# Patient Record
Sex: Female | Born: 1950
Health system: Southern US, Community
[De-identification: ages and names within clinical notes are randomized; demographics above are authoritative.]

## PROBLEM LIST (undated history)

## (undated) DIAGNOSIS — I499 Cardiac arrhythmia, unspecified: Secondary | ICD-10-CM

## (undated) DIAGNOSIS — K219 Gastro-esophageal reflux disease without esophagitis: Secondary | ICD-10-CM

## (undated) DIAGNOSIS — M8580 Other specified disorders of bone density and structure, unspecified site: Secondary | ICD-10-CM

## (undated) DIAGNOSIS — K649 Unspecified hemorrhoids: Secondary | ICD-10-CM

## (undated) DIAGNOSIS — R319 Hematuria, unspecified: Secondary | ICD-10-CM

## (undated) DIAGNOSIS — J111 Influenza due to unidentified influenza virus with other respiratory manifestations: Secondary | ICD-10-CM

## (undated) DIAGNOSIS — R011 Cardiac murmur, unspecified: Secondary | ICD-10-CM

## (undated) DIAGNOSIS — I10 Essential (primary) hypertension: Secondary | ICD-10-CM

## (undated) HISTORY — PX: HEMORRHOID BANDING: SHX5850

## (undated) HISTORY — PX: DENTAL SURGERY: SHX609

## (undated) HISTORY — PX: COLONOSCOPY W/ BIOPSIES AND POLYPECTOMY: SHX1376

## (undated) HISTORY — PX: BREAST SURGERY: SHX581

## (undated) HISTORY — PX: BREAST BIOPSY: SHX20

## (undated) HISTORY — PX: TONSILLECTOMY: SUR1361

## (undated) HISTORY — DX: Essential (primary) hypertension: I10

---

## 1983-06-27 HISTORY — PX: BREAST EXCISIONAL BIOPSY: SUR124

## 1988-06-26 HISTORY — PX: BREAST EXCISIONAL BIOPSY: SUR124

## 1998-10-19 ENCOUNTER — Other Ambulatory Visit: Admission: RE | Admit: 1998-10-19 | Discharge: 1998-10-19 | Payer: Self-pay | Admitting: Obstetrics & Gynecology

## 2000-04-16 ENCOUNTER — Other Ambulatory Visit: Admission: RE | Admit: 2000-04-16 | Discharge: 2000-04-16 | Payer: Self-pay | Admitting: Obstetrics & Gynecology

## 2001-08-06 ENCOUNTER — Other Ambulatory Visit: Admission: RE | Admit: 2001-08-06 | Discharge: 2001-08-06 | Payer: Self-pay | Admitting: Obstetrics & Gynecology

## 2002-09-05 ENCOUNTER — Other Ambulatory Visit: Admission: RE | Admit: 2002-09-05 | Discharge: 2002-09-05 | Payer: Self-pay | Admitting: Obstetrics & Gynecology

## 2002-10-19 ENCOUNTER — Ambulatory Visit (HOSPITAL_COMMUNITY): Admission: RE | Admit: 2002-10-19 | Discharge: 2002-10-19 | Payer: Self-pay | Admitting: Urology

## 2002-10-19 ENCOUNTER — Encounter: Payer: Self-pay | Admitting: Urology

## 2003-10-02 ENCOUNTER — Other Ambulatory Visit: Admission: RE | Admit: 2003-10-02 | Discharge: 2003-10-02 | Payer: Self-pay | Admitting: Obstetrics & Gynecology

## 2004-06-30 ENCOUNTER — Encounter: Admission: RE | Admit: 2004-06-30 | Discharge: 2004-06-30 | Payer: Self-pay | Admitting: Obstetrics & Gynecology

## 2004-07-07 ENCOUNTER — Encounter: Admission: RE | Admit: 2004-07-07 | Discharge: 2004-07-07 | Payer: Self-pay | Admitting: Surgery

## 2004-11-03 ENCOUNTER — Other Ambulatory Visit: Admission: RE | Admit: 2004-11-03 | Discharge: 2004-11-03 | Payer: Self-pay | Admitting: Obstetrics and Gynecology

## 2005-09-26 ENCOUNTER — Ambulatory Visit (HOSPITAL_COMMUNITY): Admission: RE | Admit: 2005-09-26 | Discharge: 2005-09-26 | Payer: Self-pay | Admitting: Obstetrics & Gynecology

## 2006-10-16 ENCOUNTER — Ambulatory Visit (HOSPITAL_COMMUNITY): Admission: RE | Admit: 2006-10-16 | Discharge: 2006-10-16 | Payer: Self-pay | Admitting: Obstetrics & Gynecology

## 2007-03-12 ENCOUNTER — Encounter: Admission: RE | Admit: 2007-03-12 | Discharge: 2007-03-12 | Payer: Self-pay | Admitting: Obstetrics & Gynecology

## 2008-07-24 ENCOUNTER — Encounter: Admission: RE | Admit: 2008-07-24 | Discharge: 2008-07-24 | Payer: Self-pay | Admitting: Obstetrics & Gynecology

## 2008-09-11 ENCOUNTER — Ambulatory Visit: Payer: Self-pay

## 2009-08-10 ENCOUNTER — Encounter: Admission: RE | Admit: 2009-08-10 | Discharge: 2009-08-10 | Payer: Self-pay | Admitting: Obstetrics & Gynecology

## 2010-07-17 ENCOUNTER — Encounter: Payer: Self-pay | Admitting: Obstetrics & Gynecology

## 2011-07-12 ENCOUNTER — Other Ambulatory Visit: Payer: Self-pay | Admitting: Obstetrics & Gynecology

## 2011-07-12 DIAGNOSIS — R928 Other abnormal and inconclusive findings on diagnostic imaging of breast: Secondary | ICD-10-CM

## 2011-07-19 ENCOUNTER — Ambulatory Visit
Admission: RE | Admit: 2011-07-19 | Discharge: 2011-07-19 | Disposition: A | Discharge status: Home / Self Care | Payer: 59 | Source: Ambulatory Visit | Attending: Obstetrics & Gynecology | Admitting: Obstetrics & Gynecology

## 2011-07-19 ENCOUNTER — Other Ambulatory Visit: Payer: Self-pay | Admitting: Obstetrics & Gynecology

## 2011-07-19 DIAGNOSIS — R928 Other abnormal and inconclusive findings on diagnostic imaging of breast: Secondary | ICD-10-CM

## 2011-08-28 ENCOUNTER — Other Ambulatory Visit: Payer: Self-pay | Admitting: Gastroenterology

## 2012-11-13 ENCOUNTER — Other Ambulatory Visit: Payer: Self-pay | Admitting: Obstetrics & Gynecology

## 2012-11-13 DIAGNOSIS — R928 Other abnormal and inconclusive findings on diagnostic imaging of breast: Secondary | ICD-10-CM

## 2012-11-15 ENCOUNTER — Ambulatory Visit
Admission: RE | Admit: 2012-11-15 | Discharge: 2012-11-15 | Disposition: A | Discharge status: Home / Self Care | Payer: 59 | Source: Ambulatory Visit | Attending: Obstetrics & Gynecology | Admitting: Obstetrics & Gynecology

## 2012-11-15 DIAGNOSIS — R928 Other abnormal and inconclusive findings on diagnostic imaging of breast: Secondary | ICD-10-CM

## 2012-11-26 ENCOUNTER — Other Ambulatory Visit: Payer: 59

## 2013-07-27 DIAGNOSIS — J111 Influenza due to unidentified influenza virus with other respiratory manifestations: Secondary | ICD-10-CM

## 2013-07-27 HISTORY — DX: Influenza due to unidentified influenza virus with other respiratory manifestations: J11.1

## 2013-08-15 ENCOUNTER — Other Ambulatory Visit (HOSPITAL_COMMUNITY): Payer: Self-pay | Admitting: Orthopaedic Surgery

## 2013-08-15 ENCOUNTER — Ambulatory Visit (HOSPITAL_COMMUNITY)
Admission: RE | Admit: 2013-08-15 | Discharge: 2013-08-15 | Disposition: A | Discharge status: Home / Self Care | Payer: Worker's Compensation | Source: Ambulatory Visit | Attending: Orthopaedic Surgery | Admitting: Orthopaedic Surgery

## 2013-08-15 DIAGNOSIS — M25571 Pain in right ankle and joints of right foot: Secondary | ICD-10-CM

## 2013-08-15 DIAGNOSIS — X58XXXA Exposure to other specified factors, initial encounter: Secondary | ICD-10-CM | POA: Insufficient documentation

## 2013-08-15 DIAGNOSIS — S82853A Displaced trimalleolar fracture of unspecified lower leg, initial encounter for closed fracture: Secondary | ICD-10-CM | POA: Insufficient documentation

## 2013-08-18 ENCOUNTER — Encounter (HOSPITAL_COMMUNITY): Payer: Self-pay | Admitting: Pharmacy Technician

## 2013-08-18 NOTE — Pre-Procedure Instructions (Addendum)
Natalie Romero  08/18/2013   Your procedure is scheduled on:  Wednesday, February 25.  [facility]/ Entrance "A" at 8:30AM.  Call this number if you have problems the morning of surgery: [phone]   Remember:   Do not eat food or drink liquids after midnight.    Take these medicines the morning of surgery with A SIP OF WATER: Estradiol-Norethindrone Acet (MIMVEY LO).   Take if needed:HYDROcodone-acetaminophen (NORCO/VICODIN), ondansetron (ZOFRAN-ODT).  Stop taking ibuprofen (ADVIL,MOTRIN).    Do not wear jewelry, make-up or nail polish.  Do not wear lotions, powders, or perfumes. You may wear deodorant.  Do not shave 48 hours prior to surgery.   Do not bring valuables to the hospital.  [facility] not responsible for any belongings or valuables.               Contacts, dentures or bridgework may not be worn into surgery.  Leave suitcase in the car. After surgery it may be brought to your room.  For patients admitted to the hospital, discharge time is determined by your treatment team.               Patients discharged the day of surgery will not be allowed to drive home.  Name and phone number of your driver: -   Special Instructions: Review  [facility] - Preparing For Surgery.   Please read over the following fact sheets that you were given: Pain Booklet, Coughing and Deep Breathing and Surgical Site Infection Prevention

## 2013-08-19 ENCOUNTER — Encounter (HOSPITAL_COMMUNITY)
Admission: RE | Admit: 2013-08-19 | Discharge: 2013-08-19 | Disposition: A | Discharge status: Home / Self Care | Payer: Worker's Compensation | Source: Ambulatory Visit | Attending: Orthopaedic Surgery | Admitting: Orthopaedic Surgery

## 2013-08-19 ENCOUNTER — Other Ambulatory Visit: Payer: Self-pay

## 2013-08-19 ENCOUNTER — Encounter (HOSPITAL_COMMUNITY): Payer: Self-pay

## 2013-08-19 DIAGNOSIS — I499 Cardiac arrhythmia, unspecified: Secondary | ICD-10-CM | POA: Diagnosis not present

## 2013-08-19 DIAGNOSIS — R011 Cardiac murmur, unspecified: Secondary | ICD-10-CM | POA: Diagnosis not present

## 2013-08-19 DIAGNOSIS — X58XXXA Exposure to other specified factors, initial encounter: Secondary | ICD-10-CM | POA: Diagnosis not present

## 2013-08-19 DIAGNOSIS — K219 Gastro-esophageal reflux disease without esophagitis: Secondary | ICD-10-CM | POA: Diagnosis not present

## 2013-08-19 DIAGNOSIS — S82853A Displaced trimalleolar fracture of unspecified lower leg, initial encounter for closed fracture: Secondary | ICD-10-CM | POA: Diagnosis not present

## 2013-08-19 HISTORY — DX: Unspecified hemorrhoids: K64.9

## 2013-08-19 HISTORY — DX: Cardiac arrhythmia, unspecified: I49.9

## 2013-08-19 HISTORY — DX: Cardiac murmur, unspecified: R01.1

## 2013-08-19 HISTORY — DX: Gastro-esophageal reflux disease without esophagitis: K21.9

## 2013-08-19 HISTORY — DX: Influenza due to unidentified influenza virus with other respiratory manifestations: J11.1

## 2013-08-19 HISTORY — DX: Hematuria, unspecified: R31.9

## 2013-08-19 HISTORY — DX: Other specified disorders of bone density and structure, unspecified site: M85.80

## 2013-08-19 LAB — CBC
HCT: 39.9 % (ref 36.0–46.0)
Hemoglobin: 13.3 g/dL (ref 12.0–15.0)
MCH: 30 pg (ref 26.0–34.0)
MCHC: 33.3 g/dL (ref 30.0–36.0)
MCV: 89.9 fL (ref 78.0–100.0)
PLATELETS: 345 10*3/uL (ref 150–400)
RBC: 4.44 MIL/uL (ref 3.87–5.11)
RDW: 13.4 % (ref 11.5–15.5)
WBC: 7.1 10*3/uL (ref 4.0–10.5)

## 2013-08-19 MED ORDER — CEFAZOLIN SODIUM-DEXTROSE 2-3 GM-% IV SOLR
2.0000 g | INTRAVENOUS | Status: AC
  Administered 2013-08-20: 2 g via INTRAVENOUS

## 2013-08-20 ENCOUNTER — Ambulatory Visit (HOSPITAL_COMMUNITY): Payer: Worker's Compensation | Admitting: Anesthesiology

## 2013-08-20 ENCOUNTER — Encounter (HOSPITAL_COMMUNITY): Payer: Worker's Compensation | Admitting: Anesthesiology

## 2013-08-20 ENCOUNTER — Ambulatory Visit (HOSPITAL_COMMUNITY): Payer: Worker's Compensation

## 2013-08-20 ENCOUNTER — Encounter (HOSPITAL_COMMUNITY)
Admission: RE | Disposition: A | Discharge status: Home / Self Care | Payer: Self-pay | Source: Ambulatory Visit | Attending: Orthopaedic Surgery

## 2013-08-20 ENCOUNTER — Observation Stay (HOSPITAL_COMMUNITY)
Admission: RE | Admit: 2013-08-20 | Discharge: 2013-08-21 | Disposition: A | Discharge status: Home / Self Care | Payer: Worker's Compensation | Source: Ambulatory Visit | Attending: Orthopaedic Surgery | Admitting: Orthopaedic Surgery

## 2013-08-20 ENCOUNTER — Encounter (HOSPITAL_COMMUNITY): Payer: Self-pay | Admitting: *Deleted

## 2013-08-20 DIAGNOSIS — I499 Cardiac arrhythmia, unspecified: Secondary | ICD-10-CM | POA: Insufficient documentation

## 2013-08-20 DIAGNOSIS — R011 Cardiac murmur, unspecified: Secondary | ICD-10-CM | POA: Insufficient documentation

## 2013-08-20 DIAGNOSIS — S82853A Displaced trimalleolar fracture of unspecified lower leg, initial encounter for closed fracture: Principal | ICD-10-CM | POA: Insufficient documentation

## 2013-08-20 DIAGNOSIS — K219 Gastro-esophageal reflux disease without esophagitis: Secondary | ICD-10-CM | POA: Insufficient documentation

## 2013-08-20 DIAGNOSIS — X58XXXA Exposure to other specified factors, initial encounter: Secondary | ICD-10-CM | POA: Insufficient documentation

## 2013-08-20 HISTORY — PX: ORIF ANKLE FRACTURE: SHX5408

## 2013-08-20 SURGERY — OPEN REDUCTION INTERNAL FIXATION (ORIF) ANKLE FRACTURE
Anesthesia: Regional | Laterality: Right

## 2013-08-20 MED ORDER — SORBITOL 70 % SOLN: 30.0000 mL | Freq: Every day | Status: DC | PRN

## 2013-08-20 MED ORDER — OXYCODONE HCL 5 MG/5ML PO SOLN: 5.0000 mg | Freq: Once | ORAL | Status: DC | PRN

## 2013-08-20 MED ORDER — ONDANSETRON HCL 4 MG/2ML IJ SOLN
INTRAMUSCULAR | Status: DC | PRN
  Administered 2013-08-20: 4 mg via INTRAVENOUS

## 2013-08-20 MED ORDER — ASPIRIN EC 325 MG PO TBEC: 325.0000 mg | DELAYED_RELEASE_TABLET | Freq: Two times a day (BID) | ORAL | Status: DC

## 2013-08-20 MED ORDER — SENNA 8.6 MG PO TABS
1.0000 | ORAL_TABLET | Freq: Two times a day (BID) | ORAL | Status: DC
  Administered 2013-08-20 – 2013-08-21 (×2): 8.6 mg via ORAL
  Filled 2013-08-20 (×3): qty 1

## 2013-08-20 MED ORDER — ARTIFICIAL TEARS OP OINT
TOPICAL_OINTMENT | OPHTHALMIC | Status: DC | PRN
  Administered 2013-08-20: 1 via OPHTHALMIC

## 2013-08-20 MED ORDER — FENTANYL CITRATE 0.05 MG/ML IJ SOLN
INTRAMUSCULAR | Status: DC | PRN
  Administered 2013-08-20 (×3): 25 ug via INTRAVENOUS
  Administered 2013-08-20: 50 ug via INTRAVENOUS
  Administered 2013-08-20 (×3): 25 ug via INTRAVENOUS
  Administered 2013-08-20: 50 ug via INTRAVENOUS

## 2013-08-20 MED ORDER — METOCLOPRAMIDE HCL 10 MG PO TABS: 5.0000 mg | ORAL_TABLET | Freq: Three times a day (TID) | ORAL | Status: DC | PRN

## 2013-08-20 MED ORDER — OXYCODONE HCL 5 MG PO TABS: 5.0000 mg | ORAL_TABLET | ORAL | Status: DC | PRN

## 2013-08-20 MED ORDER — HYDROCODONE-ACETAMINOPHEN 5-325 MG PO TABS: 1.0000 | ORAL_TABLET | Freq: Four times a day (QID) | ORAL | Status: DC | PRN

## 2013-08-20 MED ORDER — ONDANSETRON HCL 4 MG/2ML IJ SOLN
INTRAMUSCULAR | Status: AC
  Filled 2013-08-20: qty 2

## 2013-08-20 MED ORDER — FENTANYL CITRATE 0.05 MG/ML IJ SOLN
50.0000 ug | INTRAMUSCULAR | Status: DC | PRN
  Administered 2013-08-20: 50 ug via INTRAVENOUS
  Filled 2013-08-20: qty 2

## 2013-08-20 MED ORDER — HYDROCODONE-ACETAMINOPHEN 5-325 MG PO TABS: 1.0000 | ORAL_TABLET | ORAL | Status: DC | PRN

## 2013-08-20 MED ORDER — FENTANYL CITRATE 0.05 MG/ML IJ SOLN
INTRAMUSCULAR | Status: AC
  Filled 2013-08-20: qty 5

## 2013-08-20 MED ORDER — LIDOCAINE HCL (CARDIAC) 20 MG/ML IV SOLN
INTRAVENOUS | Status: AC
  Filled 2013-08-20: qty 5

## 2013-08-20 MED ORDER — FAMOTIDINE 10 MG PO TABS
10.0000 mg | ORAL_TABLET | Freq: Every day | ORAL | Status: DC | PRN
  Filled 2013-08-20: qty 1

## 2013-08-20 MED ORDER — POLYETHYLENE GLYCOL 3350 17 G PO PACK: 17.0000 g | PACK | Freq: Two times a day (BID) | ORAL | Status: DC | PRN

## 2013-08-20 MED ORDER — POLYETHYLENE GLYCOL 3350 17 GM/SCOOP PO POWD: 1.0000 | Freq: Two times a day (BID) | ORAL | Status: DC | PRN

## 2013-08-20 MED ORDER — DIPHENHYDRAMINE HCL 12.5 MG/5ML PO ELIX: 25.0000 mg | ORAL_SOLUTION | ORAL | Status: DC | PRN

## 2013-08-20 MED ORDER — KETOROLAC TROMETHAMINE 30 MG/ML IJ SOLN: 30.0000 mg | Freq: Four times a day (QID) | INTRAMUSCULAR | Status: DC | PRN

## 2013-08-20 MED ORDER — KETOROLAC TROMETHAMINE 0.5 % OP SOLN
1.0000 [drp] | Freq: Four times a day (QID) | OPHTHALMIC | Status: DC
  Administered 2013-08-20 – 2013-08-21 (×3): 1 [drp] via OPHTHALMIC
  Filled 2013-08-20: qty 3

## 2013-08-20 MED ORDER — ONDANSETRON HCL 4 MG/2ML IJ SOLN: 4.0000 mg | Freq: Once | INTRAMUSCULAR | Status: DC | PRN

## 2013-08-20 MED ORDER — 0.9 % SODIUM CHLORIDE (POUR BTL) OPTIME
TOPICAL | Status: DC | PRN
  Administered 2013-08-20: 1000 mL

## 2013-08-20 MED ORDER — CALCIUM CARBONATE-VITAMIN D 500-200 MG-UNIT PO TABS
1.0000 | ORAL_TABLET | Freq: Three times a day (TID) | ORAL | Status: DC
Start: 2013-08-20 — End: 2022-02-17

## 2013-08-20 MED ORDER — PROPOFOL 10 MG/ML IV BOLUS
INTRAVENOUS | Status: AC
  Filled 2013-08-20: qty 20

## 2013-08-20 MED ORDER — ONDANSETRON 4 MG PO TBDP: 4.0000 mg | ORAL_TABLET | Freq: Three times a day (TID) | ORAL | Status: DC | PRN

## 2013-08-20 MED ORDER — PROPOFOL 10 MG/ML IV BOLUS
INTRAVENOUS | Status: DC | PRN
  Administered 2013-08-20: 170 mg via INTRAVENOUS

## 2013-08-20 MED ORDER — CEFAZOLIN SODIUM-DEXTROSE 2-3 GM-% IV SOLR
INTRAVENOUS | Status: AC
  Filled 2013-08-20: qty 50

## 2013-08-20 MED ORDER — MIDAZOLAM HCL 2 MG/2ML IJ SOLN
1.0000 mg | INTRAMUSCULAR | Status: DC | PRN
  Administered 2013-08-20: 1 mg via INTRAVENOUS
  Filled 2013-08-20: qty 2

## 2013-08-20 MED ORDER — HYDROMORPHONE HCL PF 1 MG/ML IJ SOLN
INTRAMUSCULAR | Status: AC
  Administered 2013-08-20: 0.5 mg via INTRAVENOUS
  Filled 2013-08-20: qty 1

## 2013-08-20 MED ORDER — LACTATED RINGERS IV SOLN
INTRAVENOUS | Status: DC
  Administered 2013-08-20: 09:00:00 via INTRAVENOUS

## 2013-08-20 MED ORDER — MORPHINE SULFATE 2 MG/ML IJ SOLN: 1.0000 mg | INTRAMUSCULAR | Status: DC | PRN

## 2013-08-20 MED ORDER — OXYCODONE HCL 5 MG PO TABS: 5.0000 mg | ORAL_TABLET | Freq: Once | ORAL | Status: DC | PRN

## 2013-08-20 MED ORDER — FAMOTIDINE-CA CARB-MAG HYDROX 10-800-165 MG PO CHEW: 1.0000 | CHEWABLE_TABLET | Freq: Every day | ORAL | Status: DC | PRN

## 2013-08-20 MED ORDER — POLYETHYLENE GLYCOL 3350 17 G PO PACK: 17.0000 g | PACK | Freq: Every day | ORAL | Status: DC | PRN

## 2013-08-20 MED ORDER — METOCLOPRAMIDE HCL 5 MG/ML IJ SOLN: 5.0000 mg | Freq: Three times a day (TID) | INTRAMUSCULAR | Status: DC | PRN

## 2013-08-20 MED ORDER — CALCIUM CARBONATE 1250 (500 CA) MG PO TABS
1.0000 | ORAL_TABLET | Freq: Every day | ORAL | Status: DC
  Administered 2013-08-21: 500 mg via ORAL
  Filled 2013-08-20 (×2): qty 1

## 2013-08-20 MED ORDER — ASPIRIN EC 325 MG PO TBEC
325.0000 mg | DELAYED_RELEASE_TABLET | Freq: Two times a day (BID) | ORAL | Status: DC
  Administered 2013-08-20 – 2013-08-21 (×2): 325 mg via ORAL
  Filled 2013-08-20 (×4): qty 1

## 2013-08-20 MED ORDER — ONDANSETRON HCL 4 MG/2ML IJ SOLN: 4.0000 mg | Freq: Four times a day (QID) | INTRAMUSCULAR | Status: DC | PRN

## 2013-08-20 MED ORDER — LIDOCAINE HCL (CARDIAC) 20 MG/ML IV SOLN
INTRAVENOUS | Status: DC | PRN
  Administered 2013-08-20: 35 mg via INTRAVENOUS

## 2013-08-20 MED ORDER — HYDROMORPHONE HCL PF 1 MG/ML IJ SOLN
0.2500 mg | INTRAMUSCULAR | Status: DC | PRN
  Administered 2013-08-20 (×2): 0.5 mg via INTRAVENOUS

## 2013-08-20 MED ORDER — ONDANSETRON HCL 4 MG PO TABS: 4.0000 mg | ORAL_TABLET | Freq: Four times a day (QID) | ORAL | Status: DC | PRN

## 2013-08-20 MED ORDER — SODIUM CHLORIDE 0.9 % IV SOLN
INTRAVENOUS | Status: DC
  Administered 2013-08-20: 17:00:00 via INTRAVENOUS

## 2013-08-20 MED ORDER — ARTIFICIAL TEARS OP OINT
TOPICAL_OINTMENT | OPHTHALMIC | Status: AC
  Filled 2013-08-20: qty 3.5

## 2013-08-20 MED ORDER — MAGNESIUM CITRATE PO SOLN: 1.0000 | Freq: Once | ORAL | Status: AC | PRN

## 2013-08-20 MED ORDER — LACTATED RINGERS IV SOLN
INTRAVENOUS | Status: DC | PRN
  Administered 2013-08-20: 10:00:00 via INTRAVENOUS

## 2013-08-20 MED ORDER — HYDROCODONE-ACETAMINOPHEN 7.5-325 MG PO TABS
1.0000 | ORAL_TABLET | ORAL | Status: DC | PRN
  Administered 2013-08-20 – 2013-08-21 (×5): 2 via ORAL
  Filled 2013-08-20 (×5): qty 2

## 2013-08-20 MED ORDER — CEFAZOLIN SODIUM-DEXTROSE 2-3 GM-% IV SOLR
2.0000 g | Freq: Four times a day (QID) | INTRAVENOUS | Status: AC
  Administered 2013-08-20 – 2013-08-21 (×3): 2 g via INTRAVENOUS
  Filled 2013-08-20 (×3): qty 50

## 2013-08-20 MED ORDER — MIDAZOLAM HCL 2 MG/2ML IJ SOLN
INTRAMUSCULAR | Status: AC
  Filled 2013-08-20: qty 2

## 2013-08-20 SURGICAL SUPPLY — 71 items
1.3MM KWIRE ×4 IMPLANT
BANDAGE ELASTIC 4 VELCRO ST LF (GAUZE/BANDAGES/DRESSINGS) ×2 IMPLANT
BANDAGE ELASTIC 6 VELCRO ST LF (GAUZE/BANDAGES/DRESSINGS) ×2 IMPLANT
BANDAGE ESMARK 6X9 LF (GAUZE/BANDAGES/DRESSINGS) ×1 IMPLANT
BIT DRILL 2.7 QC CANN 155 (BIT) ×2 IMPLANT
BIT DRILL QC 2.7 6.3IN  SHORT (BIT) ×1
BIT DRILL QC 2.7 6.3IN SHORT (BIT) ×1 IMPLANT
BNDG COHESIVE 4X5 TAN STRL (GAUZE/BANDAGES/DRESSINGS) ×2 IMPLANT
BNDG COHESIVE 6X5 TAN STRL LF (GAUZE/BANDAGES/DRESSINGS) ×2 IMPLANT
BNDG ESMARK 6X9 LF (GAUZE/BANDAGES/DRESSINGS) ×2
CLOTH BEACON ORANGE TIMEOUT ST (SAFETY) ×2 IMPLANT
COVER SURGICAL LIGHT HANDLE (MISCELLANEOUS) ×2 IMPLANT
CUFF TOURNIQUET SINGLE 34IN LL (TOURNIQUET CUFF) ×2 IMPLANT
CUFF TOURNIQUET SINGLE 44IN (TOURNIQUET CUFF) IMPLANT
DRAPE C-ARM 42X72 X-RAY (DRAPES) ×2 IMPLANT
DRAPE C-ARMOR (DRAPES) ×2 IMPLANT
DRAPE INCISE IOBAN 66X45 STRL (DRAPES) ×2 IMPLANT
DRAPE U-SHAPE 47X51 STRL (DRAPES) ×2 IMPLANT
DURAPREP 26ML APPLICATOR (WOUND CARE) ×2 IMPLANT
ELECT CAUTERY BLADE 6.4 (BLADE) ×2 IMPLANT
ELECT REM PT RETURN 9FT ADLT (ELECTROSURGICAL) ×2
ELECTRODE REM PT RTRN 9FT ADLT (ELECTROSURGICAL) ×1 IMPLANT
FACESHIELD LNG OPTICON STERILE (SAFETY) ×2 IMPLANT
GAUZE XEROFORM 5X9 LF (GAUZE/BANDAGES/DRESSINGS) ×2 IMPLANT
GLOVE SURG SS PI 7.5 STRL IVOR (GLOVE) ×4 IMPLANT
GOWN STRL NON-REIN LRG LVL3 (GOWN DISPOSABLE) ×4 IMPLANT
GOWN STRL REIN XL XLG (GOWN DISPOSABLE) ×2 IMPLANT
KIT BASIN OR (CUSTOM PROCEDURE TRAY) ×2 IMPLANT
KIT ROOM TURNOVER OR (KITS) ×2 IMPLANT
NEEDLE HYPO 25GX1X1/2 BEV (NEEDLE) IMPLANT
NS IRRIG 1000ML POUR BTL (IV SOLUTION) ×2 IMPLANT
PACK ORTHO EXTREMITY (CUSTOM PROCEDURE TRAY) ×2 IMPLANT
PAD ARMBOARD 7.5X6 YLW CONV (MISCELLANEOUS) ×4 IMPLANT
PAD CAST 3X4 CTTN HI CHSV (CAST SUPPLIES) ×2 IMPLANT
PAD CAST 4YDX4 CTTN HI CHSV (CAST SUPPLIES) ×1 IMPLANT
PADDING CAST COTTON 3X4 STRL (CAST SUPPLIES) ×2
PADDING CAST COTTON 4X4 STRL (CAST SUPPLIES) ×1
PADDING CAST COTTON 6X4 STRL (CAST SUPPLIES) ×2 IMPLANT
PADDING CAST SYN 6 (CAST SUPPLIES) ×1
PADDING CAST SYNTHETIC 4 (CAST SUPPLIES) ×1
PADDING CAST SYNTHETIC 4X4 STR (CAST SUPPLIES) ×1 IMPLANT
PADDING CAST SYNTHETIC 6X4 NS (CAST SUPPLIES) ×1 IMPLANT
PLATE FIBULA DISTAL 7 HOLE (Plate) ×2 IMPLANT
SCREW 5.0X10 (Screw) ×2 IMPLANT
SCREW 5.0X12 (Screw) ×2 IMPLANT
SCREW CANC 5.0X14 (Screw) ×2 IMPLANT
SCREW CANN 6XFT 40X4X2.7X (Screw) ×1 IMPLANT
SCREW CANNULATED 4.0X40 (Screw) ×1 IMPLANT
SCREW CANNULATED 4.1X40 (Screw) ×2 IMPLANT
SCREW LOCK 3.5X14 (Screw) ×2 IMPLANT
SCREW LOCK 3.5X8 (Screw) ×2 IMPLANT
SCREW NL 3.5X8 (Screw) ×2 IMPLANT
SCREW NLCK 16X3.5XST CORT PRLC (Screw) ×1 IMPLANT
SCREW NON LOCK 3.5X12 (Screw) ×2 IMPLANT
SCREW NONLOCK 3.5X10 (Screw) ×4 IMPLANT
SCREW NONLOCK 3.5X16 (Screw) ×1 IMPLANT
SPLINT FIBERGLASS 4X30 (CAST SUPPLIES) ×4 IMPLANT
SPONGE GAUZE 4X4 12PLY (GAUZE/BANDAGES/DRESSINGS) ×2 IMPLANT
SPONGE LAP 18X18 X RAY DECT (DISPOSABLE) ×2 IMPLANT
SUCTION FRAZIER TIP 10 FR DISP (SUCTIONS) ×2 IMPLANT
SUT ETHILON 2 0 FS 18 (SUTURE) IMPLANT
SUT ETHILON 3 0 PS 1 (SUTURE) ×6 IMPLANT
SUT VIC AB 0 CT1 27 (SUTURE) ×1
SUT VIC AB 0 CT1 27XBRD ANBCTR (SUTURE) ×1 IMPLANT
SUT VIC AB 2-0 CT1 27 (SUTURE) ×2
SUT VIC AB 2-0 CT1 TAPERPNT 27 (SUTURE) ×2 IMPLANT
SYR CONTROL 10ML LL (SYRINGE) ×2 IMPLANT
TOWEL OR 17X24 6PK STRL BLUE (TOWEL DISPOSABLE) ×2 IMPLANT
TOWEL OR 17X26 10 PK STRL BLUE (TOWEL DISPOSABLE) ×4 IMPLANT
TUBE CONNECTING 12X1/4 (SUCTIONS) ×2 IMPLANT
WATER STERILE IRR 1000ML POUR (IV SOLUTION) ×2 IMPLANT

## 2013-08-20 NOTE — OR Nursing (Signed)
Pt. C/o the feeling like something is in her right eye.  Her eye is slightly red. She does get some relief from moist washcloth on her eye.  Dr. Sampson GoonFitzgerald notified and order for some saline drops received.  Saline drops given.

## 2013-08-20 NOTE — Anesthesia Procedure Notes (Signed)
Anesthesia Regional Block:  Popliteal block  Pre-Anesthetic Checklist: ,, timeout performed, Correct Patient, Correct Site, Correct Laterality, Correct Procedure, Correct Position, site marked, Risks and benefits discussed,  Surgical consent,  Pre-op evaluation,  At surgeon's request and post-op pain management  Laterality: Right  Prep: chloraprep       Needles:  Injection technique: Single-shot  Needle Type: Echogenic Stimulator Needle     Needle Length: 9cm 9 cm Needle Gauge: 22 and 22 G    Additional Needles:  Procedures: nerve stimulator Popliteal block Narrative:  Start time: 08/20/2013 10:20 AM End time: 08/20/2013 10:25 AM Injection made incrementally with aspirations every 5 mL.  Performed by: Personally   Additional Notes: 30 cc 0.5% marcaine with 1:200 Epi injected easily

## 2013-08-20 NOTE — OR Nursing (Signed)
Dr. Sampson GoonFitzgerald notified of persistent right eye discomfort.  Toradol eye drops ordered.

## 2013-08-20 NOTE — Preoperative (Signed)
Beta Blockers   Reason not to administer Beta Blockers:Not Applicable 

## 2013-08-20 NOTE — Discharge Instructions (Signed)
1. Elevate on 3 pillows with toes above level of nose 2. Take meds as prescribed 3. Strict non weight bearing  4. Keep splint clean and dry

## 2013-08-20 NOTE — Anesthesia Preprocedure Evaluation (Signed)
Anesthesia Evaluation  Patient identified by MRN, date of birth, ID band  Reviewed: Allergy & Precautions, H&P , NPO status , Patient's Chart, lab work & pertinent test results  Airway Mallampati: II TM Distance: >3 FB Neck ROM: Full    Dental  (+) Teeth Intact, Dental Advisory Given   Pulmonary  breath sounds clear to auscultation        Cardiovascular Rhythm:Regular Rate:Normal     Neuro/Psych    GI/Hepatic   Endo/Other    Renal/GU      Musculoskeletal   Abdominal   Peds  Hematology   Anesthesia Other Findings   Reproductive/Obstetrics                           Anesthesia Physical Anesthesia Plan  ASA: II  Anesthesia Plan: General   Post-op Pain Management:    Induction: Intravenous  Airway Management Planned: LMA  Additional Equipment:   Intra-op Plan:   Post-operative Plan:   Informed Consent: I have reviewed the patients History and Physical, chart, labs and discussed the procedure including the risks, benefits and alternatives for the proposed anesthesia with the patient or authorized representative who has indicated his/her understanding and acceptance.   Dental advisory given  Plan Discussed with: CRNA and Anesthesiologist  Anesthesia Plan Comments: (Fracture R. Ankle  Plan GA with LMA and popliteal block)        Anesthesia Quick Evaluation

## 2013-08-20 NOTE — Transfer of Care (Signed)
Immediate Anesthesia Transfer of Care Note  Patient: Natalie Romero  Procedure(s) Performed: Procedure(s): OPEN REDUCTION INTERNAL FIXATION (ORIF) RIGHT  ANKLE FRACTURE (Right)  Patient Location: PACU  Anesthesia Type:General and Regional  Level of Consciousness: awake, alert , oriented and sedated  Airway & Oxygen Therapy: Patient Spontanous Breathing and Patient connected to nasal cannula oxygen  Post-op Assessment: Report given to PACU RN, Post -op Vital signs reviewed and stable and Patient moving all extremities  Post vital signs: Reviewed and stable  Complications: No apparent anesthesia complications

## 2013-08-20 NOTE — Progress Notes (Signed)
Orthopedic Tech Progress Note Patient Details:  Natalie Romero [birth year] [id number]  Ortho Devices Ortho Device/Splint Location: put ohf on bed Ortho Device/Splint Interventions: Ordered;Application   Jennye MoccasinHughes, Markela Wee Craig 08/20/2013, 3:40 PM

## 2013-08-20 NOTE — Progress Notes (Signed)
CRNA at bedside. Pt to OR at 1025.

## 2013-08-20 NOTE — Op Note (Signed)
Date of Surgery: 08/20/2013  INDICATIONS: Ms. Natalie Romero is a 63 y.o.-year-old female who sustained a right ankle fracture; she was indicated for open reduction and internal fixation due to the displaced nature of the articular fracture and came to the operating room today for this procedure. The patient did consent to the procedure after discussion of the risks and benefits.  PREOPERATIVE DIAGNOSIS: right trimalleolar ankle fracture  POSTOPERATIVE DIAGNOSIS: Same.  PROCEDURE: Open treatment of right ankle fracture with internal fixation. Trimalleolar w/o fixation of posterior malleolus CPT 27822.   SURGEON: N. Glee ArvinMichael Mindel Friscia, M.D.  ASSIST: none.  ANESTHESIA:  general, regional  IV FLUIDS AND URINE: See anesthesia.  ESTIMATED BLOOD LOSS: minimal mL.  IMPLANTS: [employer]  COMPLICATIONS: None.  DESCRIPTION OF PROCEDURE: The patient was brought to the operating room and placed supine on the operating table.  The patient had been signed prior to the procedure and this was documented. The patient had the anesthesia placed by the anesthesiologist.  A nonsterile tourniquet was placed on the upper thigh.  The prep verification and incision time-outs were performed to confirm that this was the correct patient, site, side and location. The patient had an SCD on the opposite lower extremity. The patient did receive antibiotics prior to the incision and was re-dosed during the procedure as needed at indicated intervals.  The patient had the lower extremity prepped and draped in the standard surgical fashion.  The extremity was exsanguinated using an esmarch bandage and the tourniquet was inflated to 350 mm Hg.  The bony landmarks were began with fixation of the fibula. A laterally-based incision was used. Blunt dissection was taken down to the level of the fascia. The superficial peroneal nerve was encountered and mobilized and protected during the entire procedure. The fascia was sharply incised in  line with the incision. The fibula was exposed. The fracture site was also exposed. There was a great deal of comminution of the fracture. No lag screws were possible.  The fracture was reduced using manual reduction and a clamp. I then placed a 7 hole fibular plate and bridge fashion. I placed 4 screws proximal to the fracture site and 3 screws distal to the fracture site.  The fracture was visualized to maintain reduction with each screw placement. X-rays were taken to confirm adequate reduction and plate placement. I then turned my attention to the medial malleolus fracture. I use a longitudinal curvilinear incision based over the medial malleolus. Blunt dissection was taken down to the periosteum. The periosteum was sharply incised and elevated off of the medial malleolus. There was a large amount of entrapped periosteum within the fracture site. This was retrieved using a rongeur. The joint was visualized and was without damage. The fracture was reduced using a dental pick. With the fracture reduced I advanced 2 parallel K wires up to a medial malleolus perpendicular to the fracture site. The K wire placement was confirmed on x-rays. I then placed a 4.0 partially-threaded cancellus screw over the anterior pin. This allowed compression across the fracture site. I then placed a fully threaded cancellous screw over the posterior pin. Final x-rays were taken to confirm adequate fracture reduction and hardware placement. The wounds were thoroughly irrigated with saline. The wounds were closed in layer fashion using 0 Vicryl for the deep fascial layer 2-0 Vicryl for the deep skin layer and 3-0 nylon for the skin. Sterile dressings were applied and the extremity was placed in a short-leg splint. The patient awoke from  anesthesia uneventfully and was transferred to the PACU in stable condition.  POSTOPERATIVE PLAN: Ms. Edelen will remain nonweightbearing on this leg for approximately 6 weeks; Ms. Zemaitis will return for  suture removal in 2 weeks.  He will be immobilized in a short leg splint and then transitioned to a short leg cast at his first follow up appointment.  Ms. Route will receive DVT prophylaxis based on other medications, activity level, and risk ratio of bleeding to thrombosis.  Mayra Reel, MD [facility] [phone] 12:48 PM

## 2013-08-20 NOTE — Anesthesia Postprocedure Evaluation (Signed)
  Anesthesia Post-op Note  Patient: Natalie Romero  Procedure(s) Performed: Procedure(s): OPEN REDUCTION INTERNAL FIXATION (ORIF) RIGHT  ANKLE FRACTURE (Right)  Patient Location: PACU  Anesthesia Type:GA combined with regional for post-op pain  Level of Consciousness: awake, alert  and oriented  Airway and Oxygen Therapy: Patient Spontanous Breathing and Patient connected to nasal cannula oxygen  Post-op Pain: none  Post-op Assessment: Post-op Vital signs reviewed, Patient's Cardiovascular Status Stable, Respiratory Function Stable, Patent Airway and Pain level controlled  Post-op Vital Signs: stable  Complications: No apparent anesthesia complications

## 2013-08-20 NOTE — H&P (Signed)
PREOPERATIVE H&P  Chief Complaint: Right trimalleolar ankle fracture  HPI: Natalie Romero is a 63 y.o. female who presents for surgical treatment of Right trimalleolar ankle fracture.  She denies any changes in medical history.  Past Medical History  Diagnosis Date  . Heart murmur   . Dysrhythmia     Palpations  . Hematuria     cleared by urologist  . GERD (gastroesophageal reflux disease)   . Hemorrhoid   . Osteopenia   . Flu 07/27/2013   Past Surgical History  Procedure Laterality Date  . Tonsillectomy    . Breast surgery Bilateral   . Dental surgery     History   Social History  . Marital Status: Married    Spouse Name: N/A    Number of Children: N/A  . Years of Education: N/A   Social History Main Topics  . Smoking status: Never Smoker   . Smokeless tobacco: Never Used  . Alcohol Use: No  . Drug Use: No  . Sexual Activity: None   Other Topics Concern  . None   Social History Narrative  . None   History reviewed. No pertinent family history. No Known Allergies Prior to Admission medications   Medication Sig Start Date End Date Taking? Authorizing Provider  acetaminophen (TYLENOL) 500 MG tablet Take 500 mg by mouth every 6 (six) hours as needed.   Yes Historical Provider, MD  Estradiol-Norethindrone Acet (MIMVEY LO) 0.5-0.1 MG per tablet Take 1 tablet by mouth daily.   Yes Historical Provider, MD  famotidine-calcium carbonate-magnesium hydroxide (PEPCID COMPLETE) 10-800-165 MG CHEW chewable tablet Chew 1 tablet by mouth daily as needed.   Yes Historical Provider, MD  guaiFENesin (MUCINEX) 600 MG 12 hr tablet Take by mouth 2 (two) times daily.   Yes Historical Provider, MD  HYDROcodone-acetaminophen (NORCO/VICODIN) 5-325 MG per tablet Take 1 tablet by mouth every 6 (six) hours as needed for moderate pain.   Yes Historical Provider, MD  ibuprofen (ADVIL,MOTRIN) 200 MG tablet Take 600 mg by mouth every 8 (eight) hours as needed for moderate pain.   Yes Historical  Provider, MD  ondansetron (ZOFRAN-ODT) 4 MG disintegrating tablet Take 4 mg by mouth every 8 (eight) hours as needed for nausea or vomiting.   Yes Historical Provider, MD  oseltamivir (TAMIFLU) 75 MG capsule Take 75 mg by mouth.   Yes Historical Provider, MD  OVER THE COUNTER MEDICATION Take 2 capsules by mouth 2 (two) times daily as needed (for constipation due to pain medication). Dulcolax liquid gel caps   Yes Historical Provider, MD  polyethylene glycol powder (GLYCOLAX/MIRALAX) powder Take 1 Container by mouth 2 (two) times daily as needed (for constipation due to pain medication).   Yes Historical Provider, MD     Positive ROS: All other systems have been reviewed and were otherwise negative with the exception of those mentioned in the HPI and as above.  Physical Exam: General: Alert, no acute distress Cardiovascular: No pedal edema Respiratory: No cyanosis, no use of accessory musculature GI: No organomegaly, abdomen is soft and non-tender Skin: No lesions in the area of chief complaint Neurologic: Sensation intact distally Psychiatric: Patient is competent for consent with normal mood and affect Lymphatic: No axillary or cervical lymphadenopathy  MUSCULOSKELETAL:  Exam stable  Assessment: Right trimalleolar ankle fracture  Plan: Plan for Procedure(s): OPEN REDUCTION INTERNAL FIXATION (ORIF) RIGHT  ANKLE FRACTURE  The risks benefits and alternatives were discussed with the patient including but not limited to the risks of nonoperative  treatment, versus surgical intervention including infection, bleeding, nerve injury,  blood clots, cardiopulmonary complications, morbidity, mortality, among others, and they were willing to proceed.   Cheral AlmasXu, Naiping Michael, MD   08/20/2013 10:06 AM

## 2013-08-21 DIAGNOSIS — S82853A Displaced trimalleolar fracture of unspecified lower leg, initial encounter for closed fracture: Secondary | ICD-10-CM | POA: Diagnosis not present

## 2013-08-21 NOTE — Care Management Note (Signed)
CARE MANAGEMENT NOTE 08/21/2013  Patient:  Salina AprilWOOD,Pooja T   Account Number:  [id number]  Date Initiated:  08/21/2013  Documentation initiated by:  Vance PeperBRADY,Everlee Quakenbush  Subjective/Objective Assessment:   63 yr old female s/p right ankle ORIF.     Action/Plan:   Patient is under workers comp. Adj. Dominic [phone]. CM called and left message, patient will need a knee worker at discharge.   Anticipated DC Date:  08/21/2013   Anticipated DC Plan:  HOME/SELF CARE      DC Planning Services  CM consult      PAC Choice  DURABLE MEDICAL EQUIPMENT   Choice offered to / List presented to:     DME arranged  OTHER - SEE COMMENT        HH arranged  NA      Status of service:  Completed, signed off Medicare Important Message given?   (If response is "NO", the following Medicare IM given date fields will be blank) Date Medicare IM given:   Date Additional Medicare IM given:    Discharge Disposition:  HOME/SELF CARE  Per UR Regulation:    If discussed at Long Length of Stay Meetings, dates discussed:    Comments:  08/21/13  12:00p Vance PeperSusan Myles Tavella, RN BSN Casew Manager Spoke with patient, received another contact phone number for worker's comp. Called Deliah Bostoniana Hasselfield, RN CM @ [phone].  Lafonda MossesDiana asked that I fax orders to her @ [phone], and have patient go to [facility] store to pick up knee walker. I called store to confirm availability of knee walker and provided patient and her husband with the order and directions. Patient states she has crutches that she has been using for past week.

## 2013-08-21 NOTE — Progress Notes (Signed)
Pt discharged home. D/c instructions given, no questions verbalized. Vitals stable. 

## 2013-08-21 NOTE — Discharge Summary (Signed)
Physician Discharge Summary      Patient ID: Natalie Romero MRN: [id number] DOB/AGE: 03-May-1951 63 y.o.  Admit date: 08/20/2013 Discharge date: 08/21/2013  Admission Diagnoses:  Trimalleolar fracture of ankle, closed  Discharge Diagnoses:  Principal Problem:   Trimalleolar fracture of ankle, closed   Past Medical History  Diagnosis Date  . Heart murmur   . Dysrhythmia     Palpations  . Hematuria     cleared by urologist  . GERD (gastroesophageal reflux disease)   . Hemorrhoid   . Osteopenia   . Flu 07/27/2013    Surgeries: Procedure(s): OPEN REDUCTION INTERNAL FIXATION (ORIF) RIGHT  ANKLE FRACTURE on 08/20/2013   Consultants (if any):    Discharged Condition: Improved  Hospital Course: [name] is an 63 y.o. female who was admitted 08/20/2013 with a diagnosis of Trimalleolar fracture of ankle, closed and went to the operating room on 08/20/2013 and underwent the above named procedures.    She was given perioperative antibiotics:      Anti-infectives   Start     Dose/Rate Route Frequency Ordered Stop   08/20/13 1700  ceFAZolin (ANCEF) IVPB 2 g/50 mL premix     2 g 100 mL/hr over 30 Minutes Intravenous Every 6 hours 08/20/13 1515 08/21/13 0632   08/20/13 0853  ceFAZolin (ANCEF) 2-3 GM-% IVPB SOLR    Comments:  Alferd Apa   : cabinet override      08/20/13 0853 08/20/13 2059   08/20/13 0600  ceFAZolin (ANCEF) IVPB 2 g/50 mL premix     2 g 100 mL/hr over 30 Minutes Intravenous On call to O.R. 08/19/13 1413 08/20/13 1047    .  She was given sequential compression devices, early ambulation, and aspirin for DVT prophylaxis.  She benefited maximally from the hospital stay and there were no complications.    Recent vital signs:  Filed Vitals:   08/21/13 1300  BP: 110/48  Pulse: 88  Temp: 98.2 F (36.8 C)  Resp: 18    Recent laboratory studies:  Lab Results  Component Value Date   HGB 13.3 08/19/2013   Lab Results  Component Value Date   WBC 7.1  08/19/2013   PLT 345 08/19/2013   No results found for this basename: INR   No results found for this basename: NA,  K,  CL,  CO2,  bun,  creatinine,  glucose    Discharge Medications:     Medication List         acetaminophen 500 MG tablet  Commonly known as:  TYLENOL  Take 500 mg by mouth every 6 (six) hours as needed.     aspirin EC 325 MG tablet  Take 1 tablet (325 mg total) by mouth 2 (two) times daily.     calcium-vitamin D 500-200 MG-UNIT per tablet  Commonly known as:  OSCAL WITH D  Take 1 tablet by mouth 3 (three) times daily.     famotidine-calcium carbonate-magnesium hydroxide 10-800-165 MG Chew chewable tablet  Commonly known as:  PEPCID COMPLETE  Chew 1 tablet by mouth daily as needed.     guaiFENesin 600 MG 12 hr tablet  Commonly known as:  MUCINEX  Take by mouth 2 (two) times daily.     HYDROcodone-acetaminophen 5-325 MG per tablet  Commonly known as:  NORCO/VICODIN  Take 1 tablet by mouth every 6 (six) hours as needed for moderate pain.     HYDROcodone-acetaminophen 5-325 MG per tablet  Commonly known as:  NORCO  Take  1-2 tablets by mouth every 6 (six) hours as needed.     ibuprofen 200 MG tablet  Commonly known as:  ADVIL,MOTRIN  Take 600 mg by mouth every 8 (eight) hours as needed for moderate pain.     MIMVEY LO 0.5-0.1 MG per tablet  Generic drug:  Estradiol-Norethindrone Acet  Take 1 tablet by mouth daily.     ondansetron 4 MG disintegrating tablet  Commonly known as:  ZOFRAN-ODT  Take 4 mg by mouth every 8 (eight) hours as needed for nausea or vomiting.     ondansetron 4 MG disintegrating tablet  Commonly known as:  ZOFRAN-ODT  Take 1 tablet (4 mg total) by mouth every 8 (eight) hours as needed for nausea or vomiting.     oseltamivir 75 MG capsule  Commonly known as:  TAMIFLU  Take 75 mg by mouth.     OVER THE COUNTER MEDICATION  Take 2 capsules by mouth 2 (two) times daily as needed (for constipation due to pain medication). Dulcolax  liquid gel caps     oxyCODONE 5 MG immediate release tablet  Commonly known as:  Oxy IR/ROXICODONE  Take 1-3 tablets (5-15 mg total) by mouth every 4 (four) hours as needed.     polyethylene glycol powder powder  Commonly known as:  GLYCOLAX/MIRALAX  Take 1 Container by mouth 2 (two) times daily as needed (for constipation due to pain medication).        Diagnostic Studies: Dg Ankle Complete Right  08/20/2013   CLINICAL DATA:  Fracture fixation.  EXAM: RIGHT ANKLE - COMPLETE 3+ VIEW; DG C-ARM 1-60 MIN  COMPARISON:  CT scan of the right ankle 08/15/2013.  FINDINGS: 3 fluoroscopic spot views of the right ankle demonstrate lateral plate and screws for fixation of a distal fibular fracture. 2 screws fix a medial malleolar fracture. Position and alignment appear anatomic. Mildly distracted posterior malleolar fracture is noted.  IMPRESSION: ORIF medial and lateral malleolar fractures. Posterior malleolar fracture is identified as on the prior exam.   Electronically Signed   By: Drusilla Kannerhomas  Dalessio M.D.   On: 08/20/2013 13:51   Ct Ankle Right Wo Contrast  08/15/2013   CLINICAL DATA:  Trimalleolar right ankle fracture.  EXAM: CT OF THE RIGHT ANKLE WITHOUT CONTRAST  TECHNIQUE: Multidetector CT imaging was performed according to the standard protocol. Multiplanar CT image reconstructions were also generated.  COMPARISON:  None.  FINDINGS: There is a comminuted fracture of the medial malleolus without significant displacement or angulation. There is a comminuted fracture of the posterior tibial malleolus with 4.9 mm of distraction between the major fracture fragments. There is an oblique comminuted fracture of the distal fibular diaphysis with mild apex anterior angulation and 6 mm of posterior displacement of the distal fracture fragment relative to the proximal shaft. There is disruption of the tibiotalar joint with mild widening of the lateral aspect of the tibiotalar joint relative to the medial aspect.  There is severe soft tissue edema surrounding the right ankle.  There is no talar fracture. There is no calcaneal fracture. The subtalar joint is normal. The flexor, extensor and peroneal tendons are grossly intact. The Achilles tendon is intact. The plantar fascia appears intact. Incidental note is made of an os naviculare.  IMPRESSION: 1. Trimalleolar right ankle fracture with disruption of the tibiotalar joint. No ankle dislocation.   Electronically Signed   By: Elige KoHetal  Patel   On: 08/15/2013 11:53   Dg C-arm 1-60 Min  08/20/2013   CLINICAL DATA:  Fracture fixation.  EXAM: RIGHT ANKLE - COMPLETE 3+ VIEW; DG C-ARM 1-60 MIN  COMPARISON:  CT scan of the right ankle 08/15/2013.  FINDINGS: 3 fluoroscopic spot views of the right ankle demonstrate lateral plate and screws for fixation of a distal fibular fracture. 2 screws fix a medial malleolar fracture. Position and alignment appear anatomic. Mildly distracted posterior malleolar fracture is noted.  IMPRESSION: ORIF medial and lateral malleolar fractures. Posterior malleolar fracture is identified as on the prior exam.   Electronically Signed   By: Drusilla Kanner M.D.   On: 08/20/2013 13:51    Disposition: 01-Home or Self Care  Discharge Orders   Future Orders Complete By Expires   Call MD / Call 911  As directed    Comments:     If you experience chest pain or shortness of breath, CALL 911 and be transported to the hospital emergency room.  If you develope a fever above 101.5 F, pus (white drainage) or increased drainage or redness at the wound, or calf pain, call your surgeon's office.   Constipation Prevention  As directed    Comments:     Drink plenty of fluids.  Prune juice may be helpful.  You may use a stool softener, such as Colace (over the counter) 100 mg twice a day.  Use MiraLax (over the counter) for constipation as needed.   Diet - low sodium heart healthy  As directed    Driving restrictions  As directed    Comments:     No driving  for 12 weeks   Increase activity slowly as tolerated  As directed    Non weight bearing  As directed    Questions:     Laterality:     Extremity:        Follow-up Information   Follow up with Cheral Almas, MD In 2 weeks.   Specialty:  Orthopedic Surgery   Contact information:   [address] [place] [address] [phone]        Signed: Cheral Almas 08/21/2013, 8:38 PM

## 2013-08-21 NOTE — Evaluation (Signed)
Occupational Therapy Evaluation and Discharge Patient Details Name: Natalie Romero MRN: [id number] DOB: 21-Jul-1950 Today's Date: 08/21/2013 Time: [id number] OT Time Calculation (min): 39 min  OT Assessment / Plan / Recommendation History of present illness Open treatment of right ankle fracture with internal fixation. Trimalleolar w/o fixation of posterior malleolus    Clinical Impression   This 63 yo female admitted and underwent above presents to acute OT with all education completed, acute OT will sign off.    OT Assessment  Patient does not need any further OT services    Follow Up Recommendations  No OT follow up       Equipment Recommendations   (knee walker)          Precautions / Restrictions Precautions Precautions: Fall Restrictions Weight Bearing Restrictions: Yes RLE Weight Bearing: Non weight bearing   Pertinent Vitals/Pain 2/10 RLE; repositioned    ADL  Transfers/Ambulation Related to ADLs: S for all with cruthces--cruthches were [facility] to her [facility] and [facility] not ajusted properly so I adjusted them for her and she said they felt much better. Also showed her how she could go up/down steps with crutches v. the way she had been doing it. Alsl she was interested in seeing knee walker so I showed this to her as well and she practiced with it very successfully. ADL Comments: setup     Acute Rehab OT Goals Patient Stated Goal: home today  Visit Information  Last OT Received On: 08/21/13 Assistance Needed: +1 History of Present Illness: Open treatment of right ankle fracture with internal fixation. Trimalleolar w/o fixation of posterior malleolus        Prior Functioning     Home Living Family/patient expects to be discharged to:: Private residence Living Arrangements: Spouse/significant other Available Help at Discharge: Family;Friend(s);Available 24 hours/day Type of Home: House Home Access: Stairs to enter [employer]ance  Stairs-Number of Steps: 2--pt had been bumping/crawling up them Entrance Stairs-Rails: None Home Layout: One level Home Equipment: Crutches;Shower seat Prior Function Level of Independence: Needs assistance ADL's / Homemaking Assistance Needed: IADLs mainly Communication Communication: No difficulties Dominant Hand: Right         Vision/Perception Vision - History Patient Visual Report: No change from baseline   Cognition  Cognition Arousal/Alertness: Awake/alert Behavior During Therapy: WFL for tasks assessed/performed Overall Cognitive Status: Within Functional Limits for tasks assessed    Extremity/Trunk Assessment Upper Extremity Assessment Upper Extremity Assessment: Overall WFL for tasks assessed     Mobility Bed Mobility Overal bed mobility: Modified Independent Transfers Overall transfer level: Needs assistance Equipment used: Crutches Transfers: Sit to/from Stand Sit to Stand: Supervision           End of Session OT - End of Session Equipment Utilized During Treatment: Gait belt (crutches, knee walker) Activity Tolerance: Patient tolerated treatment well Patient left: in chair;with call bell/phone within reach;with family/visitor present  GO Functional Assessment Tool Used: clinical observation Functional Limitation: Self care Self Care Current Status ([id number](G8987): At least 1 percent but less than 20 percent impaired, limited or restricted Self Care Goal Status ([id number](G8988): At least 1 percent but less than 20 percent impaired, limited or restricted Self Care Discharge Status [phone]): At least 1 percent but less than 20 percent impaired, limited or restricted   Evette GeorgesLeonard, Lylah Lantis Eva [phone] 08/21/2013, 2:29 PM

## 2013-08-21 NOTE — Progress Notes (Signed)
   Subjective:  Patient reports pain as none.    Objective:   VITALS:   Filed Vitals:   08/20/13 1843 08/20/13 2002 08/21/13 0059 08/21/13 0644  BP: 148/53 143/63 132/65 115/46  Pulse: 100 100 97 72  Temp: 97.7 F (36.5 C) 98.4 F (36.9 C) 98 F (36.7 C) 98.3 F (36.8 C)  TempSrc:  Oral Oral Oral  Resp: 18 18 18 18   Height:      Weight:      SpO2: 97% 97% 96% 97%    Block still working.  No sensation or motor yet. toes wwp   Lab Results  Component Value Date   WBC 7.1 08/19/2013   HGB 13.3 08/19/2013   HCT 39.9 08/19/2013   MCV 89.9 08/19/2013   PLT 345 08/19/2013     Assessment/Plan: 1 Day Post-Op   Problem List Items Addressed This Visit     Musculoskeletal and Integument   *Trimalleolar fracture of ankle, closed - Primary   Relevant Orders      Non weight bearing      Call MD / Call 911      Diet - low sodium heart healthy      Constipation Prevention      Increase activity slowly as tolerated      Driving restrictions      Up with PT/OT DVT ppx - SCDs, ambulation, asa NWB right and lower extremity Pain control Discharge planning - dc home today Rx in chart    Cheral AlmasXu, Naiping Michael 08/21/2013, 10:59 AM [phone]

## 2013-08-22 ENCOUNTER — Encounter (HOSPITAL_COMMUNITY): Payer: Self-pay | Admitting: Orthopaedic Surgery

## 2013-10-02 ENCOUNTER — Other Ambulatory Visit: Payer: Self-pay | Admitting: Physician Assistant

## 2013-10-08 ENCOUNTER — Ambulatory Visit: Payer: Worker's Compensation | Admitting: Physical Therapy

## 2013-10-09 ENCOUNTER — Encounter: Payer: Self-pay | Admitting: Physical Therapy

## 2013-10-13 ENCOUNTER — Encounter: Payer: Self-pay | Admitting: Physical Therapy

## 2013-10-14 ENCOUNTER — Encounter: Payer: Self-pay | Admitting: Physical Therapy

## 2013-10-16 ENCOUNTER — Encounter: Payer: Self-pay | Admitting: Physical Therapy

## 2014-09-01 ENCOUNTER — Other Ambulatory Visit (HOSPITAL_COMMUNITY): Payer: Self-pay | Admitting: Orthopaedic Surgery

## 2014-09-03 ENCOUNTER — Encounter (HOSPITAL_COMMUNITY): Payer: Self-pay | Admitting: *Deleted

## 2014-09-03 MED ORDER — CEFAZOLIN SODIUM-DEXTROSE 2-3 GM-% IV SOLR
2.0000 g | INTRAVENOUS | Status: AC
  Administered 2014-09-04: 2 g via INTRAVENOUS
  Filled 2014-09-03: qty 50

## 2014-09-03 NOTE — H&P (Signed)
PREOPERATIVE H&P  Chief Complaint: symptomatic hardware right ankle  HPI: Natalie Romero is a 64 y.o. female who presents for surgical treatment of symptomatic hardware right ankle.  She denies any changes in medical history.  Past Medical History  Diagnosis Date  . Heart murmur   . Dysrhythmia     Palpations  . Hematuria     cleared by urologist  . GERD (gastroesophageal reflux disease)   . Hemorrhoid   . Osteopenia   . Flu 07/27/2013   Past Surgical History  Procedure Laterality Date  . Tonsillectomy    . Breast surgery Bilateral   . Dental surgery    . Orif ankle fracture Right 08/20/2013    Procedure: OPEN REDUCTION INTERNAL FIXATION (ORIF) RIGHT  ANKLE FRACTURE;  Surgeon: Cheral Almas, MD;  Location: [facility] OR;  Service: Orthopedics;  Laterality: Right;  . Colonoscopy w/ biopsies and polypectomy     History   Social History  . Marital Status: Married    Spouse Name: N/A  . Number of Children: N/A  . Years of Education: N/A   Social History Main Topics  . Smoking status: Never Smoker   . Smokeless tobacco: Never Used  . Alcohol Use: Yes     Comment: a glass of wine once or twice a month"  . Drug Use: No  . Sexual Activity: Not on file   Other Topics Concern  . None   Social History Narrative   Family History  Problem Relation Age of Onset  . Cancer Mother   . Pulmonary embolism Sister    No Known Allergies Prior to Admission medications   Medication Sig Start Date End Date Taking? Authorizing Provider  Estradiol-Norethindrone Acet (MIMVEY LO) 0.5-0.1 MG per tablet Take 1 tablet by mouth daily.   Yes Historical Provider, MD  ibuprofen (ADVIL,MOTRIN) 200 MG tablet Take 600 mg by mouth every 8 (eight) hours as needed for moderate pain.   Yes Historical Provider, MD  acetaminophen (TYLENOL) 500 MG tablet Take 500 mg by mouth every 6 (six) hours as needed.    Historical Provider, MD  aspirin EC 325 MG tablet Take 1 tablet (325 mg total) by mouth 2 (two) times  daily. 08/20/13   Naiping Donnelly Stager, MD  calcium-vitamin D (OSCAL WITH D) 500-200 MG-UNIT per tablet Take 1 tablet by mouth 3 (three) times daily. 08/20/13   Tarry Kos, MD  famotidine-calcium carbonate-magnesium hydroxide (PEPCID COMPLETE) 10-800-165 MG CHEW chewable tablet Chew 1 tablet by mouth daily as needed.    Historical Provider, MD  guaiFENesin (MUCINEX) 600 MG 12 hr tablet Take by mouth 2 (two) times daily.    Historical Provider, MD  HYDROcodone-acetaminophen (NORCO) 5-325 MG per tablet Take 1-2 tablets by mouth every 6 (six) hours as needed. 08/20/13   Tarry Kos, MD  HYDROcodone-acetaminophen (NORCO/VICODIN) 5-325 MG per tablet Take 1 tablet by mouth every 6 (six) hours as needed for moderate pain.    Historical Provider, MD  ondansetron (ZOFRAN-ODT) 4 MG disintegrating tablet Take 4 mg by mouth every 8 (eight) hours as needed for nausea or vomiting.    Historical Provider, MD  ondansetron (ZOFRAN-ODT) 4 MG disintegrating tablet Take 1 tablet (4 mg total) by mouth every 8 (eight) hours as needed for nausea or vomiting. 08/20/13   Tarry Kos, MD  oseltamivir (TAMIFLU) 75 MG capsule Take 75 mg by mouth.    Historical Provider, MD  OVER THE COUNTER MEDICATION Take 2 capsules by mouth 2 (two)  times daily as needed (for constipation due to pain medication). Dulcolax liquid gel caps    Historical Provider, MD  oxyCODONE (OXY IR/ROXICODONE) 5 MG immediate release tablet Take 1-3 tablets (5-15 mg total) by mouth every 4 (four) hours as needed. 08/20/13   Tarry KosNaiping M Xu, MD  polyethylene glycol powder (GLYCOLAX/MIRALAX) powder Take 1 Container by mouth 2 (two) times daily as needed (for constipation due to pain medication).    Historical Provider, MD     Positive ROS: All other systems have been reviewed and were otherwise negative with the exception of those mentioned in the HPI and as above.  Physical Exam: General: Alert, no acute distress Cardiovascular: No pedal edema Respiratory: No cyanosis,  no use of accessory musculature GI: abdomen soft Skin: No lesions in the area of chief complaint Neurologic: Sensation intact distally Psychiatric: Patient is competent for consent with normal mood and affect Lymphatic: no lymphedema  MUSCULOSKELETAL: exam stable  Assessment: symptomatic hardware right ankle  Plan: Plan for Procedure(s): HARDWARE REMOVAL RIGHT ANKLE MEDIAL MALLEOLUS SCREWS  The risks benefits and alternatives were discussed with the patient including but not limited to the risks of nonoperative treatment, versus surgical intervention including infection, bleeding, nerve injury,  blood clots, cardiopulmonary complications, morbidity, mortality, among others, and they were willing to proceed.   Cheral AlmasXu, Naiping Michael, MD   09/03/2014 8:44 PM

## 2014-09-03 NOTE — Progress Notes (Signed)
Pt denies SOB, chest pain, and being under the care of a cardiologist. Pt denies having a chest x ray and EKG w/i the last year. Pt denies having a stress test and cardiac cath but stated that an echo was performed at least 8 years ago. Pt made aware to stop taking NSAID's.

## 2014-09-04 ENCOUNTER — Ambulatory Visit (HOSPITAL_COMMUNITY): Payer: Worker's Compensation | Admitting: Anesthesiology

## 2014-09-04 ENCOUNTER — Ambulatory Visit (HOSPITAL_COMMUNITY): Payer: Worker's Compensation

## 2014-09-04 ENCOUNTER — Encounter (HOSPITAL_COMMUNITY): Payer: Self-pay | Admitting: *Deleted

## 2014-09-04 ENCOUNTER — Ambulatory Visit (HOSPITAL_COMMUNITY)
Admission: RE | Admit: 2014-09-04 | Discharge: 2014-09-04 | Disposition: A | Discharge status: Home / Self Care | Payer: Worker's Compensation | Source: Ambulatory Visit | Attending: Orthopaedic Surgery | Admitting: Orthopaedic Surgery

## 2014-09-04 ENCOUNTER — Encounter (HOSPITAL_COMMUNITY)
Admission: RE | Disposition: A | Discharge status: Home / Self Care | Payer: Self-pay | Source: Ambulatory Visit | Attending: Orthopaedic Surgery

## 2014-09-04 DIAGNOSIS — K219 Gastro-esophageal reflux disease without esophagitis: Secondary | ICD-10-CM | POA: Diagnosis not present

## 2014-09-04 DIAGNOSIS — M8580 Other specified disorders of bone density and structure, unspecified site: Secondary | ICD-10-CM | POA: Diagnosis not present

## 2014-09-04 DIAGNOSIS — T8484XA Pain due to internal orthopedic prosthetic devices, implants and grafts, initial encounter: Secondary | ICD-10-CM | POA: Insufficient documentation

## 2014-09-04 DIAGNOSIS — Y838 Other surgical procedures as the cause of abnormal reaction of the patient, or of later complication, without mention of misadventure at the time of the procedure: Secondary | ICD-10-CM | POA: Insufficient documentation

## 2014-09-04 DIAGNOSIS — Z419 Encounter for procedure for purposes other than remedying health state, unspecified: Secondary | ICD-10-CM

## 2014-09-04 HISTORY — PX: HARDWARE REMOVAL: SHX979

## 2014-09-04 HISTORY — PX: OTHER SURGICAL HISTORY: SHX169

## 2014-09-04 SURGERY — REMOVAL, HARDWARE
Anesthesia: General | Laterality: Right

## 2014-09-04 MED ORDER — PROPOFOL 10 MG/ML IV BOLUS
INTRAVENOUS | Status: DC | PRN
  Administered 2014-09-04: 170 mg via INTRAVENOUS

## 2014-09-04 MED ORDER — LIDOCAINE HCL (CARDIAC) 20 MG/ML IV SOLN
INTRAVENOUS | Status: DC | PRN
  Administered 2014-09-04: 40 mg via INTRAVENOUS

## 2014-09-04 MED ORDER — HYDROCODONE-ACETAMINOPHEN 5-325 MG PO TABS: 1.0000 | ORAL_TABLET | Freq: Four times a day (QID) | ORAL | Status: DC | PRN

## 2014-09-04 MED ORDER — MIDAZOLAM HCL 5 MG/5ML IJ SOLN
INTRAMUSCULAR | Status: DC | PRN
  Administered 2014-09-04: 2 mg via INTRAVENOUS

## 2014-09-04 MED ORDER — OXYCODONE HCL 5 MG PO TABS
ORAL_TABLET | ORAL | Status: AC
  Filled 2014-09-04: qty 1

## 2014-09-04 MED ORDER — EPHEDRINE SULFATE 50 MG/ML IJ SOLN
INTRAMUSCULAR | Status: AC
  Filled 2014-09-04: qty 1

## 2014-09-04 MED ORDER — HYDROMORPHONE HCL 1 MG/ML IJ SOLN
0.2500 mg | INTRAMUSCULAR | Status: DC | PRN
  Administered 2014-09-04 (×4): 0.5 mg via INTRAVENOUS

## 2014-09-04 MED ORDER — LIDOCAINE HCL (CARDIAC) 20 MG/ML IV SOLN
INTRAVENOUS | Status: AC
  Filled 2014-09-04: qty 5

## 2014-09-04 MED ORDER — ONDANSETRON HCL 4 MG/2ML IJ SOLN
INTRAMUSCULAR | Status: DC | PRN
  Administered 2014-09-04: 4 mg via INTRAVENOUS

## 2014-09-04 MED ORDER — BUPIVACAINE HCL (PF) 0.25 % IJ SOLN
INTRAMUSCULAR | Status: AC
  Filled 2014-09-04: qty 30

## 2014-09-04 MED ORDER — STERILE WATER FOR INJECTION IJ SOLN
INTRAMUSCULAR | Status: AC
  Filled 2014-09-04: qty 10

## 2014-09-04 MED ORDER — PROPOFOL 10 MG/ML IV BOLUS
INTRAVENOUS | Status: AC
  Filled 2014-09-04: qty 20

## 2014-09-04 MED ORDER — OXYCODONE HCL 5 MG PO TABS: 5.0000 mg | ORAL_TABLET | Freq: Once | ORAL | Status: DC | PRN

## 2014-09-04 MED ORDER — LACTATED RINGERS IV SOLN
INTRAVENOUS | Status: DC | PRN
  Administered 2014-09-04: 14:00:00 via INTRAVENOUS

## 2014-09-04 MED ORDER — OXYCODONE HCL 5 MG/5ML PO SOLN: 5.0000 mg | Freq: Once | ORAL | Status: DC | PRN

## 2014-09-04 MED ORDER — FENTANYL CITRATE 0.05 MG/ML IJ SOLN
INTRAMUSCULAR | Status: AC
  Filled 2014-09-04: qty 5

## 2014-09-04 MED ORDER — PROMETHAZINE HCL 25 MG/ML IJ SOLN: 6.2500 mg | INTRAMUSCULAR | Status: DC | PRN

## 2014-09-04 MED ORDER — HYDROMORPHONE HCL 1 MG/ML IJ SOLN
INTRAMUSCULAR | Status: AC
  Filled 2014-09-04: qty 1

## 2014-09-04 MED ORDER — LACTATED RINGERS IV SOLN
INTRAVENOUS | Status: DC
  Administered 2014-09-04: 14:00:00 via INTRAVENOUS

## 2014-09-04 MED ORDER — ONDANSETRON HCL 4 MG/2ML IJ SOLN
INTRAMUSCULAR | Status: AC
  Filled 2014-09-04: qty 2

## 2014-09-04 MED ORDER — BUPIVACAINE HCL (PF) 0.25 % IJ SOLN
INTRAMUSCULAR | Status: DC | PRN
  Administered 2014-09-04: 30 mL

## 2014-09-04 MED ORDER — MIDAZOLAM HCL 2 MG/2ML IJ SOLN
INTRAMUSCULAR | Status: AC
  Filled 2014-09-04: qty 2

## 2014-09-04 MED ORDER — FENTANYL CITRATE 0.05 MG/ML IJ SOLN
INTRAMUSCULAR | Status: DC | PRN
  Administered 2014-09-04: 50 ug via INTRAVENOUS

## 2014-09-04 MED ORDER — HYDROCODONE-ACETAMINOPHEN 5-325 MG PO TABS: 1.0000 | ORAL_TABLET | Freq: Once | ORAL | Status: DC

## 2014-09-04 SURGICAL SUPPLY — 51 items
BANDAGE ELASTIC 4 VELCRO ST LF (GAUZE/BANDAGES/DRESSINGS) ×2 IMPLANT
BANDAGE ELASTIC 6 VELCRO ST LF (GAUZE/BANDAGES/DRESSINGS) IMPLANT
BANDAGE ESMARK 6X9 LF (GAUZE/BANDAGES/DRESSINGS) ×1 IMPLANT
BNDG COHESIVE 4X5 TAN STRL (GAUZE/BANDAGES/DRESSINGS) ×2 IMPLANT
BNDG COHESIVE 6X5 TAN STRL LF (GAUZE/BANDAGES/DRESSINGS) ×2 IMPLANT
BNDG ESMARK 6X9 LF (GAUZE/BANDAGES/DRESSINGS) ×2
CANISTER SUCT 3000ML PPV (MISCELLANEOUS) ×2 IMPLANT
COVER SURGICAL LIGHT HANDLE (MISCELLANEOUS) ×2 IMPLANT
CUFF TOURNIQUET SINGLE 34IN LL (TOURNIQUET CUFF) IMPLANT
CUFF TOURNIQUET SINGLE 44IN (TOURNIQUET CUFF) IMPLANT
DRAPE C-ARM 42X72 X-RAY (DRAPES) ×2 IMPLANT
DRAPE C-ARMOR (DRAPES) ×2 IMPLANT
DRAPE IMP U-DRAPE 54X76 (DRAPES) ×2 IMPLANT
DRAPE INCISE IOBAN 66X45 STRL (DRAPES) ×2 IMPLANT
DRAPE U-SHAPE 47X51 STRL (DRAPES) ×4 IMPLANT
DRSG PAD ABDOMINAL 8X10 ST (GAUZE/BANDAGES/DRESSINGS) ×2 IMPLANT
DURAPREP 26ML APPLICATOR (WOUND CARE) ×2 IMPLANT
ELECT CAUTERY BLADE 6.4 (BLADE) ×2 IMPLANT
ELECT REM PT RETURN 9FT ADLT (ELECTROSURGICAL) ×2
ELECTRODE REM PT RTRN 9FT ADLT (ELECTROSURGICAL) ×1 IMPLANT
FACESHIELD WRAPAROUND (MASK) ×2 IMPLANT
GAUZE SPONGE 4X4 12PLY STRL (GAUZE/BANDAGES/DRESSINGS) ×2 IMPLANT
GAUZE XEROFORM 1X8 LF (GAUZE/BANDAGES/DRESSINGS) ×2 IMPLANT
GAUZE XEROFORM 5X9 LF (GAUZE/BANDAGES/DRESSINGS) ×2 IMPLANT
GLOVE NEODERM STRL 7.5 LF PF (GLOVE) ×1 IMPLANT
GLOVE SURG NEODERM 7.5  LF PF (GLOVE) ×1
GLOVE SURG SYN 7.5  E (GLOVE) ×2
GLOVE SURG SYN 7.5 E (GLOVE) ×2 IMPLANT
GOWN STRL REIN XL XLG (GOWN DISPOSABLE) ×2 IMPLANT
KIT BASIN OR (CUSTOM PROCEDURE TRAY) ×2 IMPLANT
KIT ROOM TURNOVER OR (KITS) ×2 IMPLANT
NEEDLE HYPO 25GX1X1/2 BEV (NEEDLE) IMPLANT
NS IRRIG 1000ML POUR BTL (IV SOLUTION) ×2 IMPLANT
PACK ORTHO EXTREMITY (CUSTOM PROCEDURE TRAY) ×2 IMPLANT
PAD ARMBOARD 7.5X6 YLW CONV (MISCELLANEOUS) ×4 IMPLANT
PAD CAST 3X4 CTTN HI CHSV (CAST SUPPLIES) ×2 IMPLANT
PAD CAST 4YDX4 CTTN HI CHSV (CAST SUPPLIES) ×1 IMPLANT
PADDING CAST COTTON 3X4 STRL (CAST SUPPLIES) ×2
PADDING CAST COTTON 4X4 STRL (CAST SUPPLIES) ×1
PADDING CAST COTTON 6X4 STRL (CAST SUPPLIES) ×2 IMPLANT
SPONGE GAUZE 4X4 12PLY STER LF (GAUZE/BANDAGES/DRESSINGS) ×2 IMPLANT
SPONGE LAP 18X18 X RAY DECT (DISPOSABLE) IMPLANT
SUCTION FRAZIER TIP 10 FR DISP (SUCTIONS) ×2 IMPLANT
SUT ETHILON 3 0 PS 1 (SUTURE) ×2 IMPLANT
SUT VIC AB 2-0 CT1 27 (SUTURE) ×1
SUT VIC AB 2-0 CT1 TAPERPNT 27 (SUTURE) ×1 IMPLANT
SYR CONTROL 10ML LL (SYRINGE) IMPLANT
TOWEL OR 17X24 6PK STRL BLUE (TOWEL DISPOSABLE) ×2 IMPLANT
TOWEL OR 17X26 10 PK STRL BLUE (TOWEL DISPOSABLE) ×4 IMPLANT
TUBE CONNECTING 12X1/4 (SUCTIONS) ×2 IMPLANT
WATER STERILE IRR 1000ML POUR (IV SOLUTION) ×2 IMPLANT

## 2014-09-04 NOTE — Transfer of Care (Signed)
Immediate Anesthesia Transfer of Care Note  Patient: Natalie Romero  Procedure(s) Performed: Procedure(s): HARDWARE REMOVAL RIGHT ANKLE MEDIAL MALLEOLUS SCREWS (Right)  Patient Location: PACU  Anesthesia Type:General  Level of Consciousness: awake, alert  and oriented  Airway & Oxygen Therapy: Patient Spontanous Breathing and Patient connected to nasal cannula oxygen  Post-op Assessment: Report given to RN, Post -op Vital signs reviewed and stable and Patient moving all extremities X 4  Post vital signs: Reviewed and stable  Last Vitals:  Filed Vitals:   09/04/14 1337  BP: 178/98  Pulse: 101  Temp: 36.7 C  Resp: 20    Complications: No apparent anesthesia complications

## 2014-09-04 NOTE — Anesthesia Postprocedure Evaluation (Signed)
  Anesthesia Post-op Note  Patient: Natalie Romero  Procedure(s) Performed: Procedure(s): HARDWARE REMOVAL RIGHT ANKLE MEDIAL MALLEOLUS SCREWS (Right)  Patient Location: PACU  Anesthesia Type:General  Level of Consciousness: awake, alert  and oriented  Airway and Oxygen Therapy: Patient Spontanous Breathing and Patient connected to nasal cannula oxygen  Post-op Pain: none  Post-op Assessment: Post-op Vital signs reviewed, Patient's Cardiovascular Status Stable, Respiratory Function Stable, Patent Airway, No signs of Nausea or vomiting and Pain level controlled  Post-op Vital Signs: stable  Last Vitals:  Filed Vitals:   09/04/14 1705  BP: 126/62  Pulse: 75  Temp:   Resp: 17    Complications: No apparent anesthesia complications

## 2014-09-04 NOTE — Op Note (Signed)
Date of surgery: 09/04/2014  Preoperative diagnosis: Symptomatica hardware of right ankle  Postoperative diagnosis: Same  Procedure: Removal of deep implant from right ankle  Surgeon: Glee ArvinMichael Xu, M.D.  Anesthesia: Gen.  Estimated blood loss: Minimal  Complications: None  Indications for procedure: The patient is a 64 year old female who presents today with symptomatic hardware right ankle. She had failed conservative treatment. She is aware of the risks, benefits, and alternatives to surgery and she wished to proceed.  Description of procedure: The patient was identified in the preoperative holding area. The operative site was marked by the surgeon confirmed with the patient. She is brought back to the operating room. She was placed supine on table. General anesthesia was induced. Right lower extremity was prepped and draped in standard sterile fashion. Timeout was performed. Preoperative antibiotics given. Fluoroscopy was used to localize the area of the screw heads. Incision was placed on the original incision. Full-thickness flaps were created. With sharp dissection we are able to identify the 2 screw heads. We sequentially backed these out carefully. Final x-rays taken to confirm removal of both of the medial malleolus screws. The wound was thoroughly irrigated. The wound was then closed in layer fashion using 2-0 Vicryl for the subcutaneous layer and 3-0 nylon for the skin. Sterile dressings were applied. Patient was extubated and transferred to the PACU in stable condition.  Postoperative plan: The patient will be weightbearing as tolerated to right lower extremity. She will see us in clinic in 2 weeks for suture removal.  N. Glee ArvinMichael Xu, MD [facility] [phone] 4:13 PM

## 2014-09-04 NOTE — Discharge Instructions (Signed)
1. Change dressing on Sunday and put large band aid on incision 2. Keep incision dry and clean until follow up appointment 3. May walk on ankle as tolerated  What to eat:  For your first meals, you should eat lightly; only small meals initially.  If you do not have nausea, you may eat larger meals.  Avoid spicy, greasy and heavy food.    General Anesthesia, Adult, Care After  Refer to this sheet in the next few weeks. These instructions provide you with information on caring for yourself after your procedure. Your health care provider may also give you more specific instructions. Your treatment has been planned according to current medical practices, but problems sometimes occur. Call your health care provider if you have any problems or questions after your procedure.  WHAT TO EXPECT AFTER THE PROCEDURE  After the procedure, it is typical to experience:  Sleepiness.  Nausea and vomiting. HOME CARE INSTRUCTIONS  For the first 24 hours after general anesthesia:  Have a responsible person with you.  Do not drive a car. If you are alone, do not take public transportation.  Do not drink alcohol.  Do not take medicine that has not been prescribed by your health care provider.  Do not sign important papers or make important decisions.  You may resume a normal diet and activities as directed by your health care provider.  Change bandages (dressings) as directed.  If you have questions or problems that seem related to general anesthesia, call the hospital and ask for the anesthetist or anesthesiologist on call. SEEK MEDICAL CARE IF:  You have nausea and vomiting that continue the day after anesthesia.  You develop a rash. SEEK IMMEDIATE MEDICAL CARE IF:  You have difficulty breathing.  You have chest pain.  You have any allergic problems. Document Released: 09/18/2000 Document Revised: 02/12/2013 Document Reviewed: 12/26/2012  [facility] Patient Information 2014 [place], [place].   Sore Throat     A sore throat is a painful, burning, sore, or scratchy feeling of the throat. There may be pain or tenderness when swallowing or talking. You may have other symptoms with a sore throat. These include coughing, sneezing, fever, or a swollen neck. A sore throat is often the first sign of another sickness. These sicknesses may include a cold, flu, strep throat, or an infection called mono. Most sore throats go away without medical treatment.  HOME CARE  Only take medicine as told by your doctor.  Drink enough fluids to keep your pee (urine) clear or pale yellow.  Rest as needed.  Try using throat sprays, lozenges, or suck on hard candy (if older than 4 years or as told).  Sip warm liquids, such as broth, herbal tea, or warm water with honey. Try sucking on frozen ice pops or drinking cold liquids.  Rinse the mouth (gargle) with salt water. Mix 1 teaspoon salt with 8 ounces of water.  Do not smoke. Avoid being around others when they are smoking.  Put a humidifier in your bedroom at night to moisten the air. You can also turn on a hot shower and sit in the bathroom for 5-10 minutes. Be sure the bathroom door is closed. GET HELP RIGHT AWAY IF:  You have trouble breathing.  You cannot swallow fluids, soft foods, or your spit (saliva).  You have more puffiness (swelling) in the throat.  Your sore throat does not get better in 7 days.  You feel sick to your stomach (nauseous) and throw up (vomit).  You have a fever or lasting symptoms for more than 2-3 days.  You have a fever and your symptoms suddenly get worse. MAKE SURE YOU:  Understand these instructions.  Will watch your condition.  Will get help right away if you are not doing well or get worse. Document Released: 03/21/2008 Document Revised: 03/06/2012 Document Reviewed: 02/18/2012  [facility] Patient Information 2015 [place], [place]. This information is not intended to replace advice given to you by your health care provider. Make sure you  discuss any questions you have with your health care provider.    Always eat before taking narcotics to prevent nausea and vomiting.  Narcotics can cause constipation.  Drink plenty of liquids, if your diet allows, eat lots of fresh fruits and vegetables, and consider taking a stool softener.

## 2014-09-04 NOTE — Anesthesia Procedure Notes (Signed)
Procedure Name: LMA Insertion Date/Time: 09/04/2014 3:37 PM Performed by: Rise PatienceBELL, Natalie Romero Pre-anesthesia Checklist: Patient identified, Emergency Drugs available, Suction available and Patient being monitored Patient Re-evaluated:Patient Re-evaluated prior to inductionOxygen Delivery Method: Circle system utilized Preoxygenation: Pre-oxygenation with 100% oxygen Intubation Type: IV induction Ventilation: Mask ventilation without difficulty LMA: LMA inserted LMA Size: 4.0 Number of attempts: 1 Placement Confirmation: positive ETCO2 and breath sounds checked- equal and bilateral Tube secured with: Tape Dental Injury: Teeth and Oropharynx as per pre-operative assessment

## 2014-09-04 NOTE — Anesthesia Preprocedure Evaluation (Addendum)
Anesthesia Evaluation  Patient identified by MRN, date of birth, ID band Patient awake    Reviewed: Allergy & Precautions, NPO status , Patient's Chart, lab work & pertinent test results  History of Anesthesia Complications (+) DIFFICULT AIRWAY  Airway Mallampati: II  TM Distance: >3 FB Neck ROM: Full    Dental  (+) Teeth Intact   Pulmonary neg pulmonary ROS,  breath sounds clear to auscultation- rhonchi        Cardiovascular negative cardio ROS  Rhythm:Regular Rate:Normal     Neuro/Psych negative neurological ROS     GI/Hepatic Neg liver ROS, GERD-  ,  Endo/Other  negative endocrine ROS  Renal/GU negative Renal ROS     Musculoskeletal negative musculoskeletal ROS (+)   Abdominal   Peds  Hematology negative hematology ROS (+)   Anesthesia Other Findings   Reproductive/Obstetrics                           Anesthesia Physical Anesthesia Plan  ASA: I  Anesthesia Plan: General   Post-op Pain Management:    Induction: Intravenous  Airway Management Planned: LMA  Additional Equipment:   Intra-op Plan:   Post-operative Plan: Extubation in OR  Informed Consent: I have reviewed the patients History and Physical, chart, labs and discussed the procedure including the risks, benefits and alternatives for the proposed anesthesia with the patient or authorized representative who has indicated his/her understanding and acceptance.     Plan Discussed with: CRNA  Anesthesia Plan Comments:        Anesthesia Quick Evaluation

## 2014-09-06 ENCOUNTER — Encounter (HOSPITAL_COMMUNITY): Payer: Self-pay | Admitting: Orthopaedic Surgery

## 2015-02-18 ENCOUNTER — Other Ambulatory Visit: Payer: Self-pay | Admitting: Obstetrics and Gynecology

## 2015-02-19 ENCOUNTER — Other Ambulatory Visit: Payer: Self-pay | Admitting: Obstetrics and Gynecology

## 2015-02-19 DIAGNOSIS — N644 Mastodynia: Secondary | ICD-10-CM

## 2015-02-19 LAB — CYTOLOGY - PAP

## 2015-02-24 ENCOUNTER — Other Ambulatory Visit: Payer: Self-pay

## 2015-02-25 ENCOUNTER — Other Ambulatory Visit: Payer: Self-pay | Admitting: Obstetrics and Gynecology

## 2015-02-25 ENCOUNTER — Ambulatory Visit
Admission: RE | Admit: 2015-02-25 | Discharge: 2015-02-25 | Disposition: A | Discharge status: Home / Self Care | Payer: BLUE CROSS/BLUE SHIELD | Source: Ambulatory Visit | Attending: Obstetrics and Gynecology | Admitting: Obstetrics and Gynecology

## 2015-02-25 DIAGNOSIS — N644 Mastodynia: Secondary | ICD-10-CM

## 2015-03-15 ENCOUNTER — Other Ambulatory Visit: Payer: Self-pay | Admitting: Obstetrics and Gynecology

## 2015-03-15 DIAGNOSIS — R928 Other abnormal and inconclusive findings on diagnostic imaging of breast: Secondary | ICD-10-CM

## 2015-03-25 ENCOUNTER — Inpatient Hospital Stay: Admission: RE | Admit: 2015-03-25 | Payer: Self-pay | Source: Ambulatory Visit

## 2015-04-08 ENCOUNTER — Other Ambulatory Visit: Payer: Self-pay | Admitting: Obstetrics and Gynecology

## 2015-04-08 DIAGNOSIS — R928 Other abnormal and inconclusive findings on diagnostic imaging of breast: Secondary | ICD-10-CM

## 2015-04-15 ENCOUNTER — Other Ambulatory Visit: Payer: Self-pay

## 2015-04-19 ENCOUNTER — Ambulatory Visit
Admission: RE | Admit: 2015-04-19 | Discharge: 2015-04-19 | Disposition: A | Discharge status: Home / Self Care | Payer: BLUE CROSS/BLUE SHIELD | Source: Ambulatory Visit | Attending: Obstetrics and Gynecology | Admitting: Obstetrics and Gynecology

## 2015-04-19 ENCOUNTER — Inpatient Hospital Stay: Admission: RE | Admit: 2015-04-19 | Payer: Self-pay | Source: Ambulatory Visit

## 2015-04-19 ENCOUNTER — Other Ambulatory Visit: Payer: Self-pay

## 2015-04-19 DIAGNOSIS — R928 Other abnormal and inconclusive findings on diagnostic imaging of breast: Secondary | ICD-10-CM

## 2016-03-03 ENCOUNTER — Other Ambulatory Visit: Payer: Self-pay | Admitting: Obstetrics and Gynecology

## 2016-03-03 DIAGNOSIS — Z1231 Encounter for screening mammogram for malignant neoplasm of breast: Secondary | ICD-10-CM

## 2016-04-13 ENCOUNTER — Ambulatory Visit
Admission: RE | Admit: 2016-04-13 | Discharge: 2016-04-13 | Disposition: A | Discharge status: Home / Self Care | Payer: BLUE CROSS/BLUE SHIELD | Source: Ambulatory Visit | Attending: Obstetrics and Gynecology | Admitting: Obstetrics and Gynecology

## 2016-04-13 DIAGNOSIS — Z1231 Encounter for screening mammogram for malignant neoplasm of breast: Secondary | ICD-10-CM

## 2016-04-17 ENCOUNTER — Other Ambulatory Visit: Payer: Self-pay | Admitting: Obstetrics and Gynecology

## 2016-04-17 DIAGNOSIS — R928 Other abnormal and inconclusive findings on diagnostic imaging of breast: Secondary | ICD-10-CM

## 2016-04-20 ENCOUNTER — Ambulatory Visit
Admission: RE | Admit: 2016-04-20 | Discharge: 2016-04-20 | Disposition: A | Discharge status: Home / Self Care | Payer: BLUE CROSS/BLUE SHIELD | Source: Ambulatory Visit | Attending: Obstetrics and Gynecology | Admitting: Obstetrics and Gynecology

## 2016-04-20 DIAGNOSIS — R928 Other abnormal and inconclusive findings on diagnostic imaging of breast: Secondary | ICD-10-CM

## 2016-05-29 DIAGNOSIS — J0110 Acute frontal sinusitis, unspecified: Secondary | ICD-10-CM | POA: Diagnosis not present

## 2016-05-29 DIAGNOSIS — Z6826 Body mass index (BMI) 26.0-26.9, adult: Secondary | ICD-10-CM | POA: Diagnosis not present

## 2016-08-17 DIAGNOSIS — H35051 Retinal neovascularization, unspecified, right eye: Secondary | ICD-10-CM | POA: Diagnosis not present

## 2016-08-21 DIAGNOSIS — H52203 Unspecified astigmatism, bilateral: Secondary | ICD-10-CM | POA: Diagnosis not present

## 2016-08-21 DIAGNOSIS — H524 Presbyopia: Secondary | ICD-10-CM | POA: Diagnosis not present

## 2016-08-21 DIAGNOSIS — H5203 Hypermetropia, bilateral: Secondary | ICD-10-CM | POA: Diagnosis not present

## 2016-08-30 IMAGING — MG TOMO BIOPSY, RLM
5 series · 6 of 13 positions shown · non-contrast
Comparison: Previous exams.

ADDENDUM:
Pathology revealed BENIGN BREAST TISSUE WITH PSEUDOANGIOMATOUS
STROMAL HYPERPLASIA (PASH), FIBROCYSTIC CHANGE, AND COLUMNAR CELL
CHANGE in the right breast. This was found to be concordant by Dr.
Jhon Alberto Puche.

Pathology was discussed with the patient by telephone. She reported
doing well after the biopsy with tenderness at the site. Post biopsy
instructions and care were reviewed and her questions were answered.
She was encouraged to call The [REDACTED]
for any additional concerns. She was asked to return for annual
screening mammography.
Pathology results reported by Quirijn Amazigh RN, BSN on April 20, 2015.
CLINICAL DATA: 63-year-old who presented for diagnostic mammography
with tomosynthesis due to a palpable lump in the left breast which
which shown to be a benign cyst. However, the patient had a focal
asymmetry in the upper right breast, posterior depth, that did not
have a sonographic correlate. The patient's insurance would not
cover breast MRI and therefore she presents for tomosynthesis
stereotactic biopsy of the focal asymmetry.
EXAM:
RIGHT BREAST STEREOTACTIC CORE NEEDLE BIOPSY

[R LM (1 of 3)]
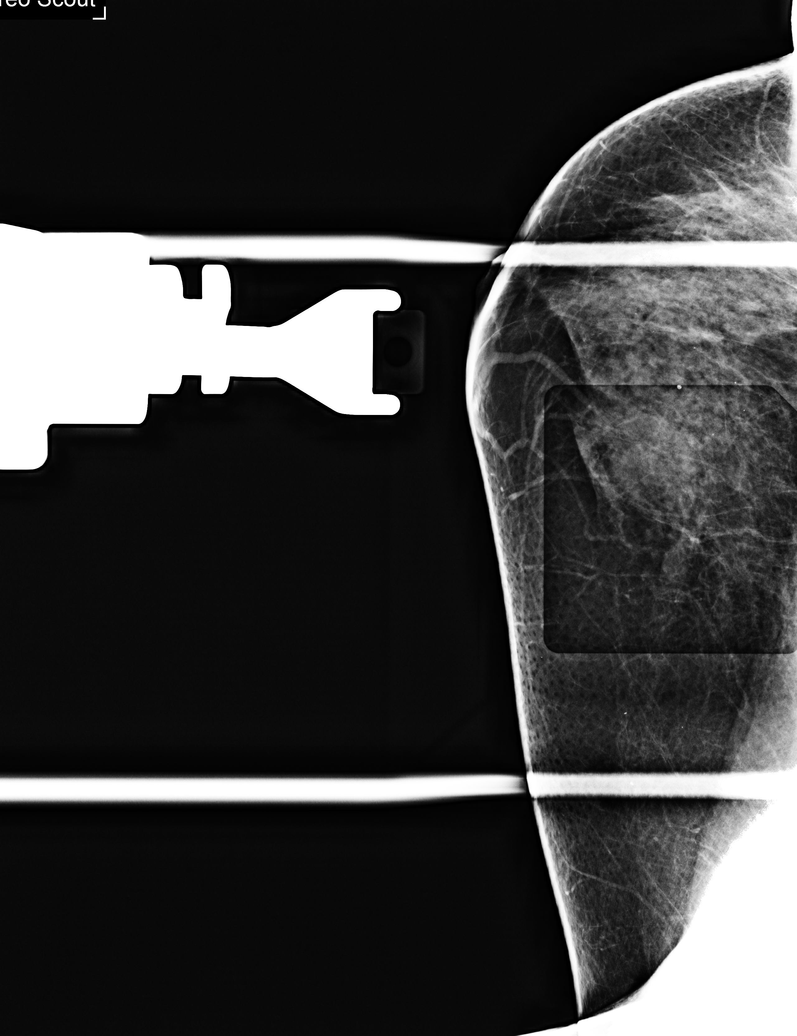

[R LM (2 of 3)]
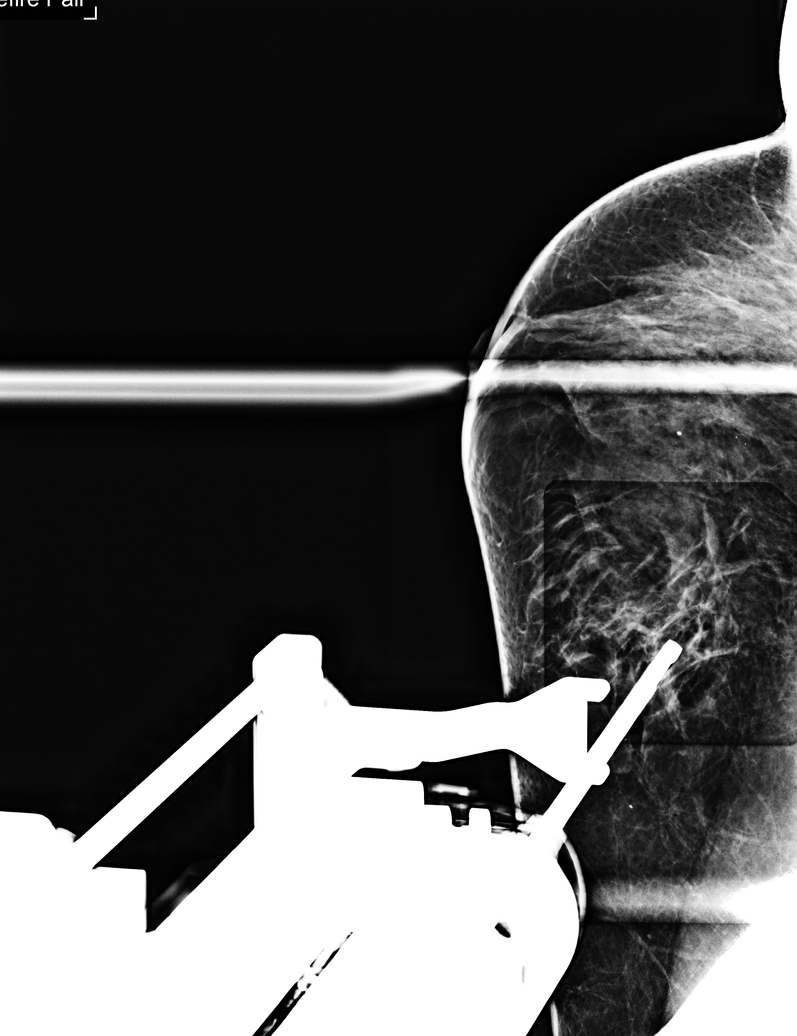

[R LM (3 of 3)]
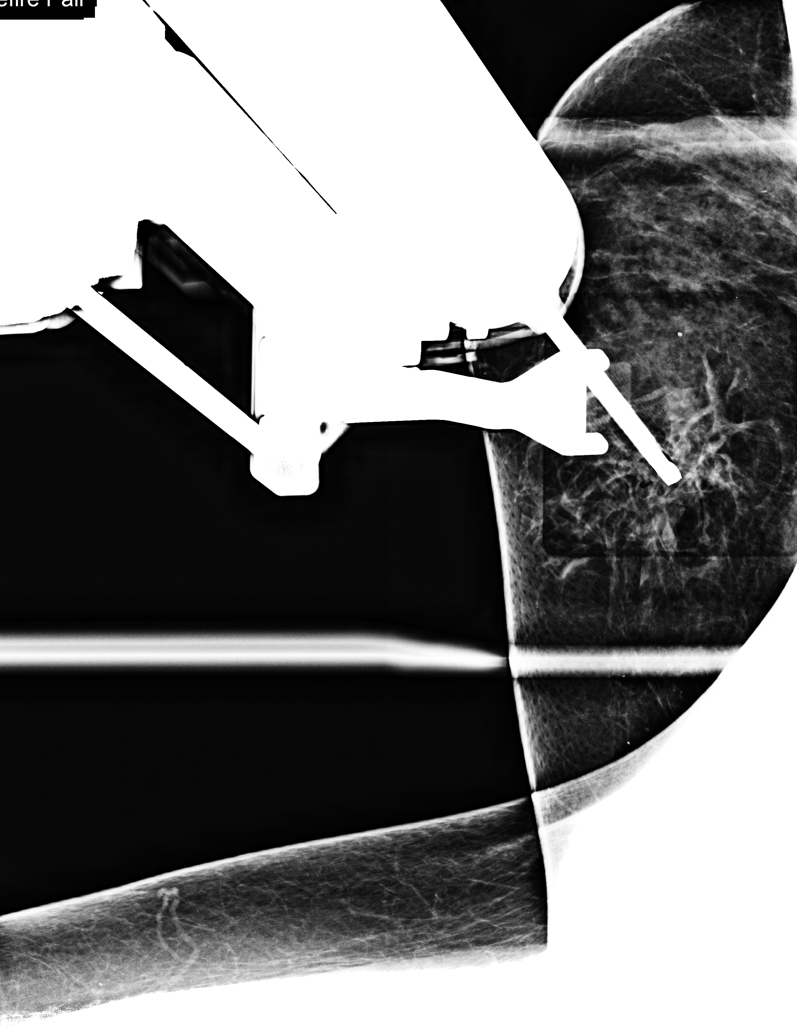

[R LM tomo · 2 of 51 frames shown (1 of 2)]
[frame 17/51]
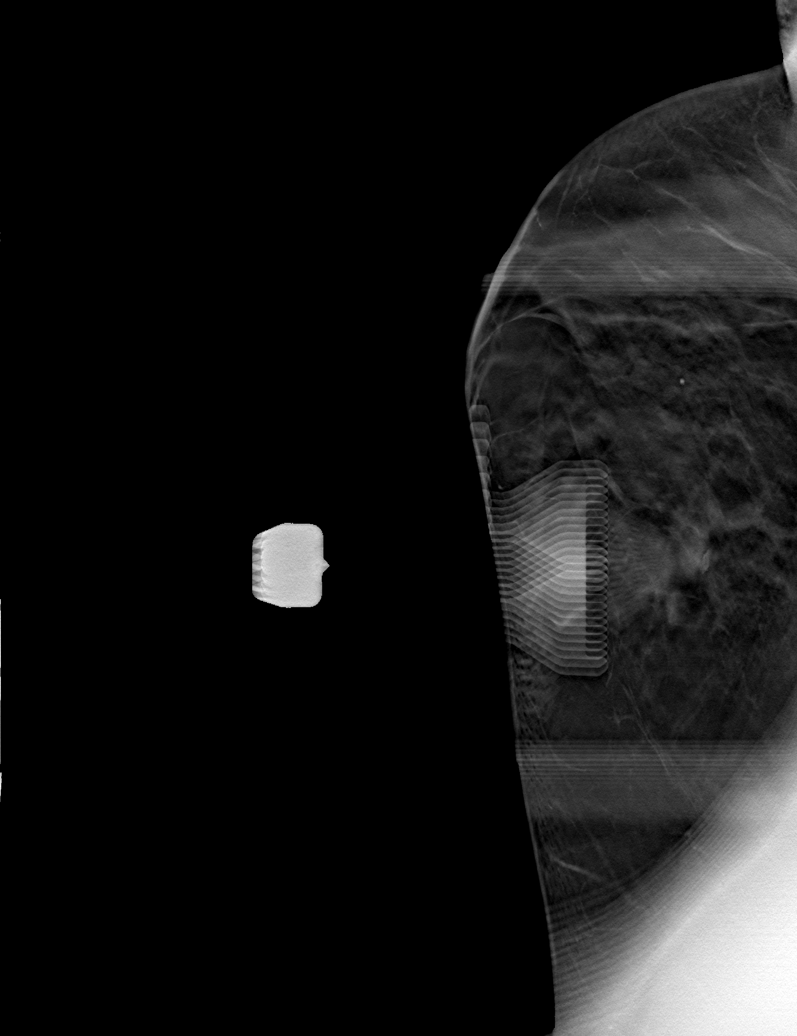
[frame 26/51]
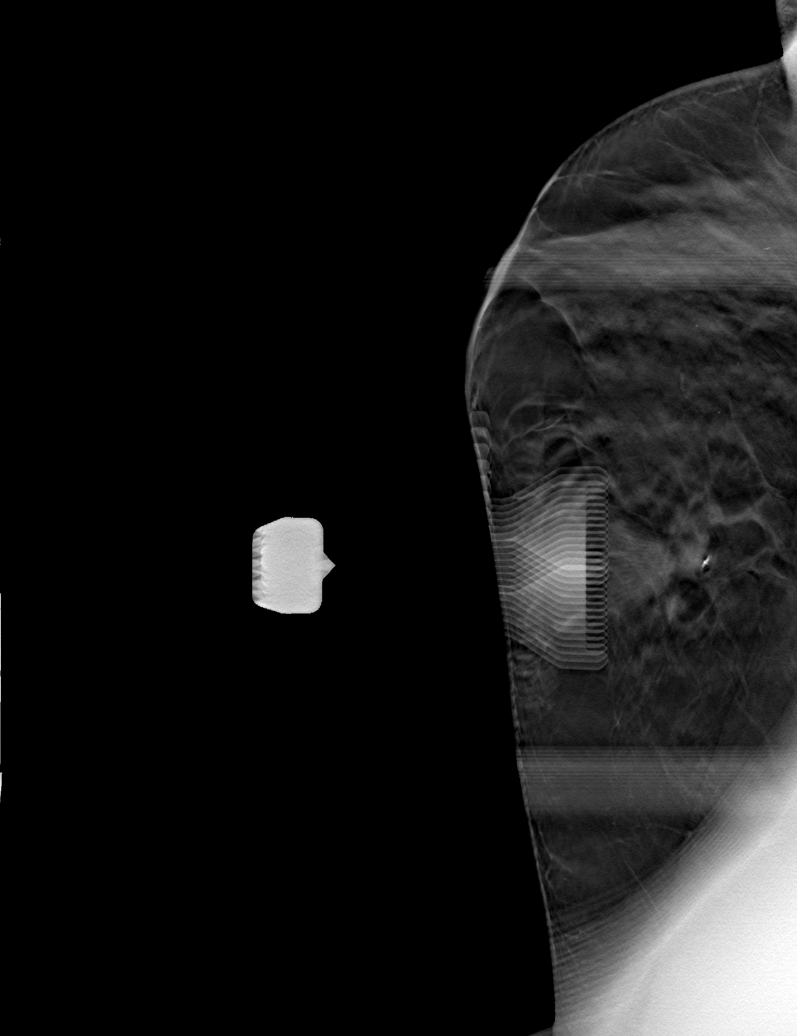

[R LM tomo (2 of 2) · tomo slice 26/51.0]
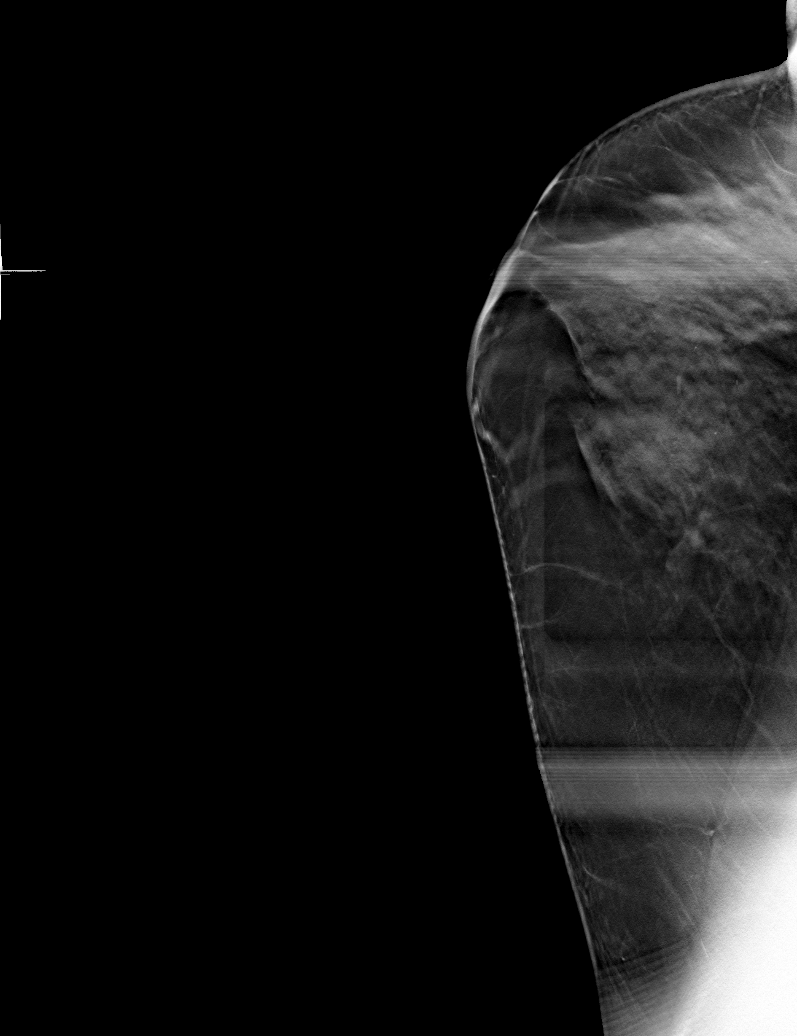

[6 of 13 positions shown; findings below may reference images not displayed]



Using sterile technique and 1% lidocaine as local anesthetic, under
stereotactic guidance, a 9 gauge vacuum assisted device was used to
perform core needle biopsy of the focal asymmetry in the upper right
breast, posterior depth using a lateral approach.

At the conclusion of the procedure, an X shaped tissue marker clip
was deployed into the biopsy cavity. Follow-up 2-view mammogram was
performed and dictated separately.
IMPRESSION: Stereotactic-guided biopsy of a focal asymmetry in the upper right
breast, posterior depth, without sonographic correlate. No apparent
complications.

## 2016-10-10 DIAGNOSIS — Z1211 Encounter for screening for malignant neoplasm of colon: Secondary | ICD-10-CM | POA: Diagnosis not present

## 2016-10-10 DIAGNOSIS — Z86010 Personal history of colonic polyps: Secondary | ICD-10-CM | POA: Diagnosis not present

## 2016-10-10 DIAGNOSIS — K648 Other hemorrhoids: Secondary | ICD-10-CM | POA: Diagnosis not present

## 2016-11-08 DIAGNOSIS — K641 Second degree hemorrhoids: Secondary | ICD-10-CM | POA: Diagnosis not present

## 2016-11-22 DIAGNOSIS — K648 Other hemorrhoids: Secondary | ICD-10-CM | POA: Diagnosis not present

## 2016-12-06 DIAGNOSIS — K648 Other hemorrhoids: Secondary | ICD-10-CM | POA: Diagnosis not present

## 2017-03-24 DIAGNOSIS — Z23 Encounter for immunization: Secondary | ICD-10-CM | POA: Diagnosis not present

## 2017-04-12 DIAGNOSIS — H35051 Retinal neovascularization, unspecified, right eye: Secondary | ICD-10-CM | POA: Diagnosis not present

## 2017-04-12 DIAGNOSIS — M810 Age-related osteoporosis without current pathological fracture: Secondary | ICD-10-CM | POA: Diagnosis not present

## 2017-04-12 DIAGNOSIS — R7301 Impaired fasting glucose: Secondary | ICD-10-CM | POA: Diagnosis not present

## 2017-04-12 DIAGNOSIS — Z0000 Encounter for general adult medical examination without abnormal findings: Secondary | ICD-10-CM | POA: Diagnosis not present

## 2017-04-12 DIAGNOSIS — H4321 Crystalline deposits in vitreous body, right eye: Secondary | ICD-10-CM | POA: Diagnosis not present

## 2017-05-11 ENCOUNTER — Other Ambulatory Visit: Payer: Self-pay | Admitting: Physician Assistant

## 2017-05-11 DIAGNOSIS — D492 Neoplasm of unspecified behavior of bone, soft tissue, and skin: Secondary | ICD-10-CM | POA: Diagnosis not present

## 2017-05-11 DIAGNOSIS — L728 Other follicular cysts of the skin and subcutaneous tissue: Secondary | ICD-10-CM | POA: Diagnosis not present

## 2017-05-11 DIAGNOSIS — D229 Melanocytic nevi, unspecified: Secondary | ICD-10-CM | POA: Diagnosis not present

## 2017-06-01 DIAGNOSIS — L570 Actinic keratosis: Secondary | ICD-10-CM | POA: Diagnosis not present

## 2017-06-27 DIAGNOSIS — Z1231 Encounter for screening mammogram for malignant neoplasm of breast: Secondary | ICD-10-CM | POA: Diagnosis not present

## 2017-06-27 DIAGNOSIS — Z6826 Body mass index (BMI) 26.0-26.9, adult: Secondary | ICD-10-CM | POA: Diagnosis not present

## 2017-06-27 DIAGNOSIS — Z01419 Encounter for gynecological examination (general) (routine) without abnormal findings: Secondary | ICD-10-CM | POA: Diagnosis not present

## 2017-06-27 DIAGNOSIS — Z13820 Encounter for screening for osteoporosis: Secondary | ICD-10-CM | POA: Diagnosis not present

## 2017-06-27 DIAGNOSIS — N760 Acute vaginitis: Secondary | ICD-10-CM | POA: Diagnosis not present

## 2017-07-18 DIAGNOSIS — N950 Postmenopausal bleeding: Secondary | ICD-10-CM | POA: Diagnosis not present

## 2017-08-09 DIAGNOSIS — L570 Actinic keratosis: Secondary | ICD-10-CM | POA: Diagnosis not present

## 2017-08-10 ENCOUNTER — Other Ambulatory Visit: Payer: Self-pay | Admitting: Obstetrics & Gynecology

## 2017-08-10 DIAGNOSIS — N840 Polyp of corpus uteri: Secondary | ICD-10-CM | POA: Diagnosis not present

## 2017-10-25 DIAGNOSIS — H4321 Crystalline deposits in vitreous body, right eye: Secondary | ICD-10-CM | POA: Diagnosis not present

## 2017-10-25 DIAGNOSIS — H35051 Retinal neovascularization, unspecified, right eye: Secondary | ICD-10-CM | POA: Diagnosis not present

## 2018-01-16 DIAGNOSIS — N950 Postmenopausal bleeding: Secondary | ICD-10-CM | POA: Diagnosis not present

## 2018-04-11 DIAGNOSIS — N950 Postmenopausal bleeding: Secondary | ICD-10-CM | POA: Diagnosis not present

## 2018-04-19 DIAGNOSIS — Z0000 Encounter for general adult medical examination without abnormal findings: Secondary | ICD-10-CM | POA: Diagnosis not present

## 2018-04-19 DIAGNOSIS — R82998 Other abnormal findings in urine: Secondary | ICD-10-CM | POA: Diagnosis not present

## 2018-04-25 DIAGNOSIS — M810 Age-related osteoporosis without current pathological fracture: Secondary | ICD-10-CM | POA: Diagnosis not present

## 2018-04-25 DIAGNOSIS — Z0000 Encounter for general adult medical examination without abnormal findings: Secondary | ICD-10-CM | POA: Diagnosis not present

## 2018-04-25 DIAGNOSIS — Z1389 Encounter for screening for other disorder: Secondary | ICD-10-CM | POA: Diagnosis not present

## 2018-04-25 DIAGNOSIS — F325 Major depressive disorder, single episode, in full remission: Secondary | ICD-10-CM | POA: Diagnosis not present

## 2018-04-25 DIAGNOSIS — Z23 Encounter for immunization: Secondary | ICD-10-CM | POA: Diagnosis not present

## 2018-04-25 DIAGNOSIS — R7301 Impaired fasting glucose: Secondary | ICD-10-CM | POA: Diagnosis not present

## 2018-04-25 DIAGNOSIS — R030 Elevated blood-pressure reading, without diagnosis of hypertension: Secondary | ICD-10-CM | POA: Diagnosis not present

## 2018-06-12 DIAGNOSIS — H5203 Hypermetropia, bilateral: Secondary | ICD-10-CM | POA: Diagnosis not present

## 2018-06-12 DIAGNOSIS — H524 Presbyopia: Secondary | ICD-10-CM | POA: Diagnosis not present

## 2018-06-12 DIAGNOSIS — H52201 Unspecified astigmatism, right eye: Secondary | ICD-10-CM | POA: Diagnosis not present

## 2018-06-13 DIAGNOSIS — H35051 Retinal neovascularization, unspecified, right eye: Secondary | ICD-10-CM | POA: Diagnosis not present

## 2018-06-13 DIAGNOSIS — H4321 Crystalline deposits in vitreous body, right eye: Secondary | ICD-10-CM | POA: Diagnosis not present

## 2018-08-06 ENCOUNTER — Other Ambulatory Visit: Payer: Self-pay | Admitting: Obstetrics & Gynecology

## 2018-08-06 DIAGNOSIS — R928 Other abnormal and inconclusive findings on diagnostic imaging of breast: Secondary | ICD-10-CM

## 2018-08-07 ENCOUNTER — Ambulatory Visit
Admission: RE | Admit: 2018-08-07 | Discharge: 2018-08-07 | Disposition: A | Discharge status: Home / Self Care | Payer: Medicare Other | Source: Ambulatory Visit | Attending: Obstetrics & Gynecology | Admitting: Obstetrics & Gynecology

## 2018-08-07 DIAGNOSIS — R928 Other abnormal and inconclusive findings on diagnostic imaging of breast: Secondary | ICD-10-CM

## 2020-02-12 ENCOUNTER — Other Ambulatory Visit (HOSPITAL_COMMUNITY): Payer: Self-pay | Admitting: Oncology

## 2020-02-12 ENCOUNTER — Ambulatory Visit (HOSPITAL_COMMUNITY)
Admission: RE | Admit: 2020-02-12 | Discharge: 2020-02-12 | Disposition: A | Discharge status: Home / Self Care | Payer: Medicare Other | Source: Ambulatory Visit | Attending: Pulmonary Disease | Admitting: Pulmonary Disease

## 2020-02-12 DIAGNOSIS — U071 COVID-19: Secondary | ICD-10-CM

## 2020-02-12 DIAGNOSIS — Z23 Encounter for immunization: Secondary | ICD-10-CM | POA: Diagnosis not present

## 2020-02-12 MED ORDER — SODIUM CHLORIDE 0.9 % IV SOLN
1200.0000 mg | Freq: Once | INTRAVENOUS | Status: AC
  Administered 2020-02-12: 1200 mg via INTRAVENOUS
  Filled 2020-02-12: qty 10

## 2020-02-12 MED ORDER — SODIUM CHLORIDE 0.9 % IV SOLN: INTRAVENOUS | Status: DC | PRN

## 2020-02-12 MED ORDER — METHYLPREDNISOLONE SODIUM SUCC 125 MG IJ SOLR: 125.0000 mg | Freq: Once | INTRAMUSCULAR | Status: DC | PRN

## 2020-02-12 MED ORDER — FAMOTIDINE IN NACL 20-0.9 MG/50ML-% IV SOLN: 20.0000 mg | Freq: Once | INTRAVENOUS | Status: DC | PRN

## 2020-02-12 MED ORDER — DIPHENHYDRAMINE HCL 50 MG/ML IJ SOLN: 50.0000 mg | Freq: Once | INTRAMUSCULAR | Status: DC | PRN

## 2020-02-12 MED ORDER — ALBUTEROL SULFATE HFA 108 (90 BASE) MCG/ACT IN AERS: 2.0000 | INHALATION_SPRAY | Freq: Once | RESPIRATORY_TRACT | Status: DC | PRN

## 2020-02-12 MED ORDER — EPINEPHRINE 0.3 MG/0.3ML IJ SOAJ: 0.3000 mg | Freq: Once | INTRAMUSCULAR | Status: DC | PRN

## 2020-02-12 NOTE — Discharge Instructions (Signed)

## 2020-02-12 NOTE — Progress Notes (Signed)
  Diagnosis: COVID-19  Physician:Dr Wright  Procedure: [facility] Med: casirivimab\imdevimab infusion - Provided patient with casirivimab\imdevimab fact sheet for patients, parents and caregivers prior to infusion.  Complications: No immediate complications noted.  Discharge: Discharged home   Natalie Romero 02/12/2020  

## 2020-02-12 NOTE — Progress Notes (Signed)
I connected by phone with  Mrs. Maddux on 02/12/2020 at 11:00am to discuss the potential use of an new treatment for mild to moderate COVID-19 viral infection in non-hospitalized patients.   This patient is a age/sex that meets the FDA criteria for Emergency Use Authorization of casirivimab\imdevimab.  Has a (+) direct SARS-CoV-2 viral test result 1. Has mild or moderate COVID-19  2. Is ? 67 years of age and weighs ? 40 kg 3. Is NOT hospitalized due to COVID-19 4. Is NOT requiring oxygen therapy or requiring an increase in baseline oxygen flow rate due to COVID-19 5. Is within 10 days of symptom onset 6. Has at least one of the high risk factor(s) for progression to severe COVID-19 and/or hospitalization as defined in EUA. ? Specific high risk criteria : Age   Symptom onset  02/12/2020.   I have spoken and communicated the following to the patient or parent/caregiver:   1. FDA has authorized the emergency use of casirivimab\imdevimab for the treatment of mild to moderate COVID-19 in adults and pediatric patients with positive results of direct SARS-CoV-2 viral testing who are 75 years of age and older weighing at least 40 kg, and who are at high risk for progressing to severe COVID-19 and/or hospitalization.   2. The significant known and potential risks and benefits of casirivimab\imdevimab, and the extent to which such potential risks and benefits are unknown.   3. Information on available alternative treatments and the risks and benefits of those alternatives, including clinical trials.   4. Patients treated with casirivimab\imdevimab should continue to self-isolate and use infection control measures (e.g., wear mask, isolate, social distance, avoid sharing personal items, clean and disinfect "high touch" surfaces, and frequent handwashing) according to CDC guidelines.    5. The patient or parent/caregiver has the option to accept or refuse casirivimab\imdevimab .   After reviewing this  information with the patient, The patient agreed to proceed with receiving casirivimab\imdevimab infusion and will be provided a copy of the Fact sheet prior to receiving the infusion.Mignon Pine, AGNP-C [phone] (Infusion Center Hotline)

## 2020-05-07 ENCOUNTER — Other Ambulatory Visit: Payer: Self-pay | Admitting: Internal Medicine

## 2020-05-07 DIAGNOSIS — E785 Hyperlipidemia, unspecified: Secondary | ICD-10-CM

## 2020-05-31 ENCOUNTER — Ambulatory Visit
Admission: RE | Admit: 2020-05-31 | Discharge: 2020-05-31 | Disposition: A | Discharge status: Home / Self Care | Payer: No Typology Code available for payment source | Source: Ambulatory Visit | Attending: Internal Medicine | Admitting: Internal Medicine

## 2020-05-31 DIAGNOSIS — E785 Hyperlipidemia, unspecified: Secondary | ICD-10-CM

## 2020-10-01 DIAGNOSIS — Z6826 Body mass index (BMI) 26.0-26.9, adult: Secondary | ICD-10-CM | POA: Diagnosis not present

## 2020-10-01 DIAGNOSIS — Z01419 Encounter for gynecological examination (general) (routine) without abnormal findings: Secondary | ICD-10-CM | POA: Diagnosis not present

## 2020-10-01 DIAGNOSIS — Z1231 Encounter for screening mammogram for malignant neoplasm of breast: Secondary | ICD-10-CM | POA: Diagnosis not present

## 2020-12-29 ENCOUNTER — Ambulatory Visit: Payer: Medicare PPO | Admitting: Physician Assistant

## 2021-01-19 ENCOUNTER — Other Ambulatory Visit: Payer: Self-pay

## 2021-01-19 ENCOUNTER — Ambulatory Visit: Payer: Medicare PPO | Admitting: Physician Assistant

## 2021-01-19 ENCOUNTER — Encounter: Payer: Self-pay | Admitting: Physician Assistant

## 2021-01-19 DIAGNOSIS — Z808 Family history of malignant neoplasm of other organs or systems: Secondary | ICD-10-CM

## 2021-01-19 DIAGNOSIS — Z1283 Encounter for screening for malignant neoplasm of skin: Secondary | ICD-10-CM

## 2021-01-19 DIAGNOSIS — L84 Corns and callosities: Secondary | ICD-10-CM

## 2021-01-19 NOTE — Progress Notes (Signed)
   Follow-Up Visit   Subjective  Natalie Romero is a 70 y.o. female who presents for the following: Annual Exam (Here for annual skin exam. Concerns left ear, right breast, right toe, and left neck area. No history of skin cancers. Patient does have family history of non mole skin cancers. ).   The following portions of the chart were reviewed this encounter and updated as appropriate:  Tobacco  Allergies  Meds  Problems  Med Hx  Surg Hx  Fam Hx      Objective  Well appearing patient in no apparent distress; mood and affect are within normal limits.  A full examination was performed including scalp, head, eyes, ears, nose, lips, neck, chest, axillae, abdomen, back, buttocks, bilateral upper extremities, bilateral lower extremities, hands, feet, fingers, toes, fingernails, and toenails. All findings within normal limits unless otherwise noted below.  Right Foot - Anterior Right foot comes and goes  Head Skin exam full body no skin cancer found today or atypia   Assessment & Plan  Corns and callosities Right Foot - Anterior  Corn pad or file   Screening exam for skin cancer Head  Keep yearly exam   I, Levaeh Vice, PA-C, have reviewed all documentation's for this visit.  The documentation on 01/19/21 for the exam, diagnosis, procedures and orders are all accurate and complete.

## 2021-03-03 DIAGNOSIS — H4321 Crystalline deposits in vitreous body, right eye: Secondary | ICD-10-CM | POA: Diagnosis not present

## 2021-03-03 DIAGNOSIS — H35051 Retinal neovascularization, unspecified, right eye: Secondary | ICD-10-CM | POA: Diagnosis not present

## 2021-05-06 DIAGNOSIS — R7301 Impaired fasting glucose: Secondary | ICD-10-CM | POA: Diagnosis not present

## 2021-05-06 DIAGNOSIS — M810 Age-related osteoporosis without current pathological fracture: Secondary | ICD-10-CM | POA: Diagnosis not present

## 2021-05-06 DIAGNOSIS — E785 Hyperlipidemia, unspecified: Secondary | ICD-10-CM | POA: Diagnosis not present

## 2021-05-06 DIAGNOSIS — I10 Essential (primary) hypertension: Secondary | ICD-10-CM | POA: Diagnosis not present

## 2021-05-17 DIAGNOSIS — R82998 Other abnormal findings in urine: Secondary | ICD-10-CM | POA: Diagnosis not present

## 2021-05-17 DIAGNOSIS — Z86010 Personal history of colonic polyps: Secondary | ICD-10-CM | POA: Diagnosis not present

## 2021-05-17 DIAGNOSIS — R7301 Impaired fasting glucose: Secondary | ICD-10-CM | POA: Diagnosis not present

## 2021-05-17 DIAGNOSIS — F325 Major depressive disorder, single episode, in full remission: Secondary | ICD-10-CM | POA: Diagnosis not present

## 2021-05-17 DIAGNOSIS — M810 Age-related osteoporosis without current pathological fracture: Secondary | ICD-10-CM | POA: Diagnosis not present

## 2021-05-17 DIAGNOSIS — R002 Palpitations: Secondary | ICD-10-CM | POA: Diagnosis not present

## 2021-05-17 DIAGNOSIS — I10 Essential (primary) hypertension: Secondary | ICD-10-CM | POA: Diagnosis not present

## 2021-05-17 DIAGNOSIS — E785 Hyperlipidemia, unspecified: Secondary | ICD-10-CM | POA: Diagnosis not present

## 2021-05-17 DIAGNOSIS — Z1331 Encounter for screening for depression: Secondary | ICD-10-CM | POA: Diagnosis not present

## 2021-05-17 DIAGNOSIS — Z0000 Encounter for general adult medical examination without abnormal findings: Secondary | ICD-10-CM | POA: Diagnosis not present

## 2021-07-28 DIAGNOSIS — L03213 Periorbital cellulitis: Secondary | ICD-10-CM | POA: Diagnosis not present

## 2021-07-28 DIAGNOSIS — H00011 Hordeolum externum right upper eyelid: Secondary | ICD-10-CM | POA: Diagnosis not present

## 2021-12-16 DIAGNOSIS — Z6826 Body mass index (BMI) 26.0-26.9, adult: Secondary | ICD-10-CM | POA: Diagnosis not present

## 2021-12-16 DIAGNOSIS — Z1231 Encounter for screening mammogram for malignant neoplasm of breast: Secondary | ICD-10-CM | POA: Diagnosis not present

## 2021-12-16 DIAGNOSIS — Z124 Encounter for screening for malignant neoplasm of cervix: Secondary | ICD-10-CM | POA: Diagnosis not present

## 2021-12-16 DIAGNOSIS — Z1151 Encounter for screening for human papillomavirus (HPV): Secondary | ICD-10-CM | POA: Diagnosis not present

## 2021-12-20 ENCOUNTER — Other Ambulatory Visit: Payer: Self-pay | Admitting: Obstetrics and Gynecology

## 2021-12-20 DIAGNOSIS — R928 Other abnormal and inconclusive findings on diagnostic imaging of breast: Secondary | ICD-10-CM

## 2021-12-28 ENCOUNTER — Ambulatory Visit
Admission: RE | Admit: 2021-12-28 | Discharge: 2021-12-28 | Disposition: A | Discharge status: Home / Self Care | Payer: Medicare PPO | Source: Ambulatory Visit | Attending: Obstetrics and Gynecology | Admitting: Obstetrics and Gynecology

## 2021-12-28 DIAGNOSIS — R922 Inconclusive mammogram: Secondary | ICD-10-CM | POA: Diagnosis not present

## 2021-12-28 DIAGNOSIS — R928 Other abnormal and inconclusive findings on diagnostic imaging of breast: Secondary | ICD-10-CM

## 2021-12-28 DIAGNOSIS — N6002 Solitary cyst of left breast: Secondary | ICD-10-CM | POA: Diagnosis not present

## 2022-01-02 ENCOUNTER — Telehealth: Payer: Self-pay | Admitting: Internal Medicine

## 2022-01-02 NOTE — Telephone Encounter (Signed)
Good Morning Dr. Leone Payor,   We have received patients records from her previous GI provider Dr. Kinnie Scales. Patient is requesting to be seen by you, due to Dr. Kinnie Scales retiring. Patient last colonoscopy was in 2018. I will be sending patients records for you to review, will you please review and advise on scheduling?  Thank you.

## 2022-01-04 ENCOUNTER — Encounter: Payer: Self-pay | Admitting: Internal Medicine

## 2022-01-04 NOTE — Telephone Encounter (Signed)
OK to schedule a colonoscopy direct

## 2022-01-04 NOTE — Telephone Encounter (Signed)
Patient was scheduled for PV on 8/7 at 1:30 and colon on 8/25 at 10:30.

## 2022-01-16 ENCOUNTER — Telehealth: Payer: Self-pay | Admitting: *Deleted

## 2022-01-16 NOTE — Telephone Encounter (Signed)
Dr Holley Raring pt has a direct colon scheduled with you on 8/25. She is a transfer of care since her physician Dr Kinnie Scales has retired. She has a documented history of a difficult intubation, would you like an OV or a direct to [facility]?  Thank you,  Misty Stanley PV

## 2022-01-17 NOTE — Telephone Encounter (Signed)
I see where that was indicated on one of her anesthesia pre-procedure notes. I ? If that is a mistake as I look at her surgical history.  If we could call her and ask her if she knows any details of this it would be helpful - if she truly has a difficult airway then she will need to go on my waiting list for hospital procedures which I will place her on and I will schedule her when I have availabaility though that may be months from now.   So if you are able to contact her for any clarification on the difficult airway tag placed on one of her pre-anesthesia notes (no known history of endotracheal intubation from my chart review)  I am ccing Cathlyn Parsons for assistance as well  Thanks

## 2022-01-19 ENCOUNTER — Encounter: Payer: Self-pay | Admitting: Physician Assistant

## 2022-01-19 NOTE — Telephone Encounter (Signed)
Spoke with pt and asked her about difficult intubation history. She denies any knowledge of anesthesia or airway issues. States she has a medical background and would certainly know if someone had told her about a difficult intubation history.   Pt also says she doesn't think she has ever been intubated so feels that must have been charted in error. Please advise.

## 2022-01-19 NOTE — Telephone Encounter (Signed)
Misty Stanley,  Since the pt appears to be reliable, we can proceed at [facility].  Thanks,  Cathlyn Parsons

## 2022-01-19 NOTE — Telephone Encounter (Signed)
Called pt to ask about difficult intubation history, no answer. Left message asking pt to call back about upcoming procedure.

## 2022-01-19 NOTE — Telephone Encounter (Signed)
I agree. Thanks.

## 2022-01-30 ENCOUNTER — Ambulatory Visit (AMBULATORY_SURGERY_CENTER): Payer: Self-pay | Admitting: *Deleted

## 2022-01-30 ENCOUNTER — Other Ambulatory Visit: Payer: Self-pay

## 2022-01-30 VITALS — Ht 61.0 in | Wt 135.0 lb

## 2022-01-30 DIAGNOSIS — Z86010 Personal history of colonic polyps: Secondary | ICD-10-CM

## 2022-01-30 NOTE — Progress Notes (Signed)
pre visit completed in person.  No egg or soy allergy known to patient  No issues known to pt with past sedation with any surgeries or procedures Patient denies ever being told they had issues or difficulty with intubation  No FH of Malignant Hyperthermia Pt is not on diet pills Pt is not on  home 02  Pt is not on blood thinners  Pt denies issues with constipation  No A fib or A flutter Pt instructed to use Singlecare.com or GoodRx for a price reduction on prep

## 2022-02-09 ENCOUNTER — Encounter: Payer: Self-pay | Admitting: Internal Medicine

## 2022-02-16 NOTE — Progress Notes (Signed)
[facility] History and Physical   Primary Care Physician:  Cleatis Polka., MD   Reason for Procedure:   Hx colon polyps  Plan:    colonoscopy     HPI: Natalie Romero is a 71 y.o. female here for surveillance exam multiple polyps over the years ([place]) serrated adenoma, adenomas - last exam 2018 no polyps (thought)   Past Medical History:  Diagnosis Date   Dysrhythmia    Palpations   Flu 07/27/2013   GERD (gastroesophageal reflux disease)    Heart murmur    Hematuria    cleared by urologist   Hemorrhoid    Hypertension    Osteopenia     Past Surgical History:  Procedure Laterality Date   BREAST BIOPSY Right    BREAST EXCISIONAL BIOPSY Left 1985   BREAST EXCISIONAL BIOPSY Right 1990   BREAST SURGERY Bilateral    COLONOSCOPY W/ BIOPSIES AND POLYPECTOMY     DENTAL SURGERY     HARDWARE REMOVAL Right 09/04/2014   Procedure: HARDWARE REMOVAL RIGHT ANKLE MEDIAL MALLEOLUS SCREWS;  Surgeon: Tarry Kos, MD;  Location: [facility] OR;  Service: Orthopedics;  Laterality: Right;   ORIF ANKLE FRACTURE Right 08/20/2013   Procedure: OPEN REDUCTION INTERNAL FIXATION (ORIF) RIGHT  ANKLE FRACTURE;  Surgeon: Cheral Almas, MD;  Location: [facility] OR;  Service: Orthopedics;  Laterality: Right;   removal of right ankle screws Right 09/04/2014   medial malleolus-2 screws   TONSILLECTOMY      Prior to Admission medications   Medication Sig Start Date End Date Taking? Authorizing Provider  acetaminophen (TYLENOL) 500 MG tablet Take 500 mg by mouth every 6 (six) hours as needed.    [provider]  calcium-vitamin D (OSCAL WITH D) 500-200 MG-UNIT per tablet Take 1 tablet by mouth 3 (three) times daily. Patient not taking: Reported on 01/30/2022 08/20/13   Tarry Kos, MD  hydrochlorothiazide (HYDRODIURIL) 12.5 MG tablet Take 12.5 mg by mouth every morning. 11/03/21   [provider]  ibuprofen (ADVIL,MOTRIN) 200 MG tablet Take 600 mg by mouth every 8 (eight) hours  as needed for moderate pain.    [provider]  polyethylene glycol powder (GLYCOLAX/MIRALAX) powder Take 1 Container by mouth 2 (two) times daily as needed (for constipation due to pain medication).    [provider]    Current Outpatient Medications  Medication Sig Dispense Refill   hydrochlorothiazide (HYDRODIURIL) 12.5 MG tablet Take 12.5 mg by mouth every morning.     polyethylene glycol powder (GLYCOLAX/MIRALAX) powder Take 1 Container by mouth 2 (two) times daily as needed (for constipation due to pain medication).     acetaminophen (TYLENOL) 500 MG tablet Take 500 mg by mouth every 6 (six) hours as needed.     calcium-vitamin D (OSCAL WITH D) 500-200 MG-UNIT per tablet Take 1 tablet by mouth 3 (three) times daily. (Patient not taking: Reported on 01/30/2022) 90 tablet 3   ibuprofen (ADVIL,MOTRIN) 200 MG tablet Take 600 mg by mouth every 8 (eight) hours as needed for moderate pain.     Current Facility-Administered Medications  Medication Dose Route Frequency Provider Last Rate Last Admin   0.9 %  sodium chloride infusion  500 mL Intravenous Once Iva Boop, MD        Allergies as of 02/17/2022 - Review Complete 02/17/2022  Allergen Reaction Noted   Oxycodone Nausea Only 09/04/2014    Family History  Problem Relation Age of Onset   Cancer Mother  Pulmonary embolism Sister    Colon cancer Neg Hx    Colon polyps Neg Hx    Rectal cancer Neg Hx    Stomach cancer Neg Hx    Esophageal cancer Neg Hx     Social History   Socioeconomic History   Marital status: Married    Spouse name: Not on file   Number of children: Not on file   Years of education: Not on file   Highest education level: Not on file  Occupational History   Not on file  Tobacco Use   Smoking status: Never   Smokeless tobacco: Never  Vaping Use   Vaping Use: Never used  Substance and Sexual Activity   Alcohol use: Yes    Comment: a glass of wine once or twice a month"   Drug  use: No   Sexual activity: Not on file  Other Topics Concern   Not on file  Social History Narrative   Not on file   Social Determinants of Health   Financial Resource Strain: Not on file  Food Insecurity: Not on file  Transportation Needs: Not on file  Physical Activity: Not on file  Stress: Not on file  Social Connections: Not on file  Intimate Partner Violence: Not on file    Review of Systems:  All other review of systems negative except as mentioned in the HPI.  Physical Exam: Vital signs BP (!) 140/72 (BP Location: Left Arm, Patient Position: Sitting, Cuff Size: Normal)   Pulse 98   Temp (!) 97.3 F (36.3 C) (Temporal)   Ht 5\' 1"  (1.549 m)   Wt 135 lb (61.2 kg)   SpO2 100%   BMI 25.51 kg/m   General:   Alert,  Well-developed, well-nourished, pleasant and cooperative in NAD Lungs:  Clear throughout to auscultation.   Heart:  Regular rate and rhythm; no murmurs, clicks, rubs,  or gallops. Abdomen:  Soft, nontender and nondistended. Normal bowel sounds.   Neuro/Psych:  Alert and cooperative. Normal mood and affect. A and O x 3   , MD, [facility]

## 2022-02-17 ENCOUNTER — Encounter: Payer: Self-pay | Admitting: Internal Medicine

## 2022-02-17 ENCOUNTER — Ambulatory Visit (AMBULATORY_SURGERY_CENTER): Payer: Medicare PPO | Admitting: Internal Medicine

## 2022-02-17 VITALS — BP 118/59 | HR 78 | Temp 97.3°F | Resp 12 | Ht 61.0 in | Wt 135.0 lb

## 2022-02-17 DIAGNOSIS — Z09 Encounter for follow-up examination after completed treatment for conditions other than malignant neoplasm: Secondary | ICD-10-CM | POA: Diagnosis not present

## 2022-02-17 DIAGNOSIS — Z860100 Personal history of colon polyps, unspecified: Secondary | ICD-10-CM

## 2022-02-17 HISTORY — DX: Personal history of colon polyps, unspecified: Z86.0100

## 2022-02-17 MED ORDER — SODIUM CHLORIDE 0.9 % IV SOLN: 500.0000 mL | Freq: Once | INTRAVENOUS | Status: DC

## 2022-02-17 NOTE — Patient Instructions (Addendum)
No polyps today!  Your next routine colonoscopy should be in 5 years - 2028.  I appreciate the opportunity to care for you. Carl E. Gessner, MD, FACG  YOU HAD AN ENDOSCOPIC PROCEDURE TODAY AT THE [facility]:   Refer to the procedure report that was given to you for any specific questions about what was found during the examination.  If the procedure report does not answer your questions, please call your gastroenterologist to clarify.  If you requested that your care partner not be given the details of your procedure findings, then the procedure report has been included in a sealed envelope for you to review at your convenience later.  YOU SHOULD EXPECT: Some feelings of bloating in the abdomen. Passage of more gas than usual.  Walking can help get rid of the air that was put into your GI tract during the procedure and reduce the bloating. If you had a lower endoscopy (such as a colonoscopy or flexible sigmoidoscopy) you may notice spotting of blood in your stool or on the toilet paper. If you underwent a bowel prep for your procedure, you may not have a normal bowel movement for a few days.  Please Note:  You might notice some irritation and congestion in your nose or some drainage.  This is from the oxygen used during your procedure.  There is no need for concern and it should clear up in a day or so.  SYMPTOMS TO REPORT IMMEDIATELY:  Following lower endoscopy (colonoscopy or flexible sigmoidoscopy):  Excessive amounts of blood in the stool  Significant tenderness or worsening of abdominal pains  Swelling of the abdomen that is new, acute  Fever of 100F or higher   For urgent or emergent issues, a gastroenterologist can be reached at any hour by calling [phone]. Do not use MyChart messaging for urgent concerns.    DIET:  We do recommend a small meal at first, but then you may proceed to your regular diet.  Drink plenty of fluids but you should avoid alcoholic  beverages for 24 hours.  ACTIVITY:  You should plan to take it easy for the rest of today and you should NOT DRIVE or use heavy machinery until tomorrow (because of the sedation medicines used during the test).    FOLLOW UP: Our staff will call the number listed on your records the next business day following your procedure.  We will call around 7:15- 8:00 am to check on you and address any questions or concerns that you may have regarding the information given to you following your procedure. If we do not reach you, we will leave a message.  If you develop any symptoms (ie: fever, flu-like symptoms, shortness of breath, cough etc.) before then, please call [phone].  If you test positive for Covid 19 in the 2 weeks post procedure, please call and report this information to us.    If any biopsies were taken you will be contacted by phone or by letter within the next 1-3 weeks.  Please call us at [phone] if you have not heard about the biopsies in 3 weeks.    SIGNATURES/CONFIDENTIALITY: You and/or your care partner have signed paperwork which will be entered into your electronic medical record.  These signatures attest to the fact that that the information above on your After Visit Summary has been reviewed and is understood.  Full responsibility of the confidentiality of this discharge information lies with you and/or your care-partner.  

## 2022-02-17 NOTE — Op Note (Signed)
[facility] Patient Name: Natalie Romero Procedure Date: 02/17/2022 10:48 AM MRN: [id number] Endoscopist: Iva Boop , MD Age: 71 Referring MD:  Date of Birth: [birth year] Gender: Female Account #: [id number] Procedure:                Colonoscopy Indications:              High risk colon cancer surveillance: Personal                            history of colonic polyps, Last colonoscopy: 2018 Medicines:                Monitored Anesthesia Care Procedure:                Pre-Anesthesia Assessment:                           - Prior to the procedure, a History and Physical                            was performed, and patient medications and                            allergies were reviewed. The patient's tolerance of                            previous anesthesia was also reviewed. The risks                            and benefits of the procedure and the sedation                            options and risks were discussed with the patient.                            All questions were answered, and informed consent                            was obtained. Prior Anticoagulants: The patient has                            taken no previous anticoagulant or antiplatelet                            agents. ASA Grade Assessment: II - A patient with                            mild systemic disease. After reviewing the risks                            and benefits, the patient was deemed in                            satisfactory condition to undergo the procedure.  After obtaining informed consent, the colonoscope                            was passed under direct vision. Throughout the                            procedure, the patient's blood pressure, pulse, and                            oxygen saturations were monitored continuously. The                            Olympus CF-HQ190L (Serial# [id number]) Colonoscope was                            introduced  through the anus and advanced to the the                            cecum, identified by appendiceal orifice and                            ileocecal valve. The colonoscopy was performed                            without difficulty. The patient tolerated the                            procedure well. The quality of the bowel                            preparation was good. The ileocecal valve,                            appendiceal orifice, and rectum were photographed.                            The bowel preparation used was Miralax via split                            dose instruction. Scope In: 10:58:54 AM Scope Out: 11:15:52 AM Scope Withdrawal Time: 0 hours 12 minutes 56 seconds  Total Procedure Duration: 0 hours 16 minutes 58 seconds  Findings:                 The perianal and digital rectal examinations were                            normal.                           A post banding scars was found in the rectum.                           The exam was otherwise without abnormality on  direct and retroflexion views. Complications:            No immediate complications. Estimated Blood Loss:     Estimated blood loss: none. Impression:               - The examination was otherwise normal on direct                            and retroflexion views.                           - No specimens collected.                           - Personal history of colonic polyps. 7 mm rectal                            adenoma 2006, 12 mm serrated adenoma 2013 Recommendation:           - Patient has a contact number available for                            emergencies. The signs and symptoms of potential                            delayed complications were discussed with the                            patient. Return to normal activities tomorrow.                            Written discharge instructions were provided to the                            patient.                            - Resume previous diet.                           - Continue present medications.                           - Repeat colonoscopy in 5 years for surveillance. Iva Boop, MD 02/17/2022 11:25:30 AM This report has been signed electronically.

## 2022-02-17 NOTE — Progress Notes (Signed)
To pacu, VSS. Report to [email or web address] 

## 2022-02-20 ENCOUNTER — Telehealth: Payer: Self-pay

## 2022-02-20 NOTE — Telephone Encounter (Signed)
Attempted to reach patient for post-procedure f/u call. No answer. Left message for her to please not hesitate to call us if she has any questions/concerns regarding her care. 

## 2022-02-28 DIAGNOSIS — R3129 Other microscopic hematuria: Secondary | ICD-10-CM | POA: Diagnosis not present

## 2022-02-28 DIAGNOSIS — M545 Low back pain, unspecified: Secondary | ICD-10-CM | POA: Diagnosis not present

## 2022-03-01 ENCOUNTER — Encounter: Payer: Medicare PPO | Admitting: Physician Assistant

## 2022-03-30 DIAGNOSIS — H4321 Crystalline deposits in vitreous body, right eye: Secondary | ICD-10-CM | POA: Diagnosis not present

## 2022-03-30 DIAGNOSIS — H35051 Retinal neovascularization, unspecified, right eye: Secondary | ICD-10-CM | POA: Diagnosis not present

## 2022-04-16 DIAGNOSIS — R000 Tachycardia, unspecified: Secondary | ICD-10-CM | POA: Diagnosis not present

## 2022-04-16 DIAGNOSIS — J40 Bronchitis, not specified as acute or chronic: Secondary | ICD-10-CM | POA: Diagnosis not present

## 2022-06-02 DIAGNOSIS — E785 Hyperlipidemia, unspecified: Secondary | ICD-10-CM | POA: Diagnosis not present

## 2022-06-02 DIAGNOSIS — R7301 Impaired fasting glucose: Secondary | ICD-10-CM | POA: Diagnosis not present

## 2022-06-02 DIAGNOSIS — R7989 Other specified abnormal findings of blood chemistry: Secondary | ICD-10-CM | POA: Diagnosis not present

## 2022-06-02 DIAGNOSIS — I10 Essential (primary) hypertension: Secondary | ICD-10-CM | POA: Diagnosis not present

## 2022-06-12 DIAGNOSIS — Z86010 Personal history of colonic polyps: Secondary | ICD-10-CM | POA: Diagnosis not present

## 2022-06-12 DIAGNOSIS — Z1339 Encounter for screening examination for other mental health and behavioral disorders: Secondary | ICD-10-CM | POA: Diagnosis not present

## 2022-06-12 DIAGNOSIS — Z1331 Encounter for screening for depression: Secondary | ICD-10-CM | POA: Diagnosis not present

## 2022-06-12 DIAGNOSIS — M810 Age-related osteoporosis without current pathological fracture: Secondary | ICD-10-CM | POA: Diagnosis not present

## 2022-06-12 DIAGNOSIS — E785 Hyperlipidemia, unspecified: Secondary | ICD-10-CM | POA: Diagnosis not present

## 2022-06-12 DIAGNOSIS — R7301 Impaired fasting glucose: Secondary | ICD-10-CM | POA: Diagnosis not present

## 2022-06-12 DIAGNOSIS — I10 Essential (primary) hypertension: Secondary | ICD-10-CM | POA: Diagnosis not present

## 2022-06-12 DIAGNOSIS — Z0000 Encounter for general adult medical examination without abnormal findings: Secondary | ICD-10-CM | POA: Diagnosis not present

## 2022-06-12 DIAGNOSIS — L650 Telogen effluvium: Secondary | ICD-10-CM | POA: Diagnosis not present

## 2022-08-21 DIAGNOSIS — H5202 Hypermetropia, left eye: Secondary | ICD-10-CM | POA: Diagnosis not present

## 2022-08-21 DIAGNOSIS — H52222 Regular astigmatism, left eye: Secondary | ICD-10-CM | POA: Diagnosis not present

## 2022-12-18 DIAGNOSIS — Z6827 Body mass index (BMI) 27.0-27.9, adult: Secondary | ICD-10-CM | POA: Diagnosis not present

## 2022-12-18 DIAGNOSIS — Z8262 Family history of osteoporosis: Secondary | ICD-10-CM | POA: Diagnosis not present

## 2022-12-18 DIAGNOSIS — Z01419 Encounter for gynecological examination (general) (routine) without abnormal findings: Secondary | ICD-10-CM | POA: Diagnosis not present

## 2022-12-18 DIAGNOSIS — Z1231 Encounter for screening mammogram for malignant neoplasm of breast: Secondary | ICD-10-CM | POA: Diagnosis not present

## 2022-12-18 DIAGNOSIS — N958 Other specified menopausal and perimenopausal disorders: Secondary | ICD-10-CM | POA: Diagnosis not present

## 2022-12-18 DIAGNOSIS — M816 Localized osteoporosis [Lequesne]: Secondary | ICD-10-CM | POA: Diagnosis not present

## 2022-12-25 DIAGNOSIS — L820 Inflamed seborrheic keratosis: Secondary | ICD-10-CM | POA: Diagnosis not present

## 2022-12-25 DIAGNOSIS — L578 Other skin changes due to chronic exposure to nonionizing radiation: Secondary | ICD-10-CM | POA: Diagnosis not present

## 2022-12-25 DIAGNOSIS — L821 Other seborrheic keratosis: Secondary | ICD-10-CM | POA: Diagnosis not present

## 2022-12-25 DIAGNOSIS — D1801 Hemangioma of skin and subcutaneous tissue: Secondary | ICD-10-CM | POA: Diagnosis not present

## 2022-12-25 DIAGNOSIS — D229 Melanocytic nevi, unspecified: Secondary | ICD-10-CM | POA: Diagnosis not present

## 2022-12-25 DIAGNOSIS — L814 Other melanin hyperpigmentation: Secondary | ICD-10-CM | POA: Diagnosis not present

## 2023-02-05 DIAGNOSIS — M810 Age-related osteoporosis without current pathological fracture: Secondary | ICD-10-CM | POA: Diagnosis not present

## 2023-02-16 DIAGNOSIS — M810 Age-related osteoporosis without current pathological fracture: Secondary | ICD-10-CM | POA: Diagnosis not present

## 2023-02-20 ENCOUNTER — Telehealth: Payer: Self-pay | Admitting: Pharmacy Technician

## 2023-02-20 ENCOUNTER — Other Ambulatory Visit: Payer: Self-pay | Admitting: Pharmacy Technician

## 2023-02-20 DIAGNOSIS — R0981 Nasal congestion: Secondary | ICD-10-CM | POA: Diagnosis not present

## 2023-02-20 DIAGNOSIS — R000 Tachycardia, unspecified: Secondary | ICD-10-CM | POA: Diagnosis not present

## 2023-02-20 DIAGNOSIS — Z1152 Encounter for screening for COVID-19: Secondary | ICD-10-CM | POA: Diagnosis not present

## 2023-02-20 DIAGNOSIS — R0600 Dyspnea, unspecified: Secondary | ICD-10-CM | POA: Diagnosis not present

## 2023-02-20 DIAGNOSIS — R509 Fever, unspecified: Secondary | ICD-10-CM | POA: Diagnosis not present

## 2023-02-20 DIAGNOSIS — M810 Age-related osteoporosis without current pathological fracture: Secondary | ICD-10-CM | POA: Insufficient documentation

## 2023-02-20 DIAGNOSIS — R051 Acute cough: Secondary | ICD-10-CM | POA: Diagnosis not present

## 2023-02-20 DIAGNOSIS — E876 Hypokalemia: Secondary | ICD-10-CM | POA: Diagnosis not present

## 2023-02-20 DIAGNOSIS — J069 Acute upper respiratory infection, unspecified: Secondary | ICD-10-CM | POA: Diagnosis not present

## 2023-02-20 DIAGNOSIS — R638 Other symptoms and signs concerning food and fluid intake: Secondary | ICD-10-CM | POA: Diagnosis not present

## 2023-02-20 NOTE — Telephone Encounter (Signed)
Auth Submission: NO AUTH NEEDED Site of care: Site of care: [facility] Payer: [employer] MEDICARE Medication & CPT/J Code(s) submitted: Reclast (Zolendronic acid) [id number] Route of submission (phone, fax, portal):  Phone # Fax # Auth type: Buy/Bill PB Units/visits requested: X1 DOSE Reference number:  Approval from: 02/20/23 to 06/26/23

## 2023-03-27 ENCOUNTER — Ambulatory Visit: Payer: Medicare PPO | Admitting: *Deleted

## 2023-03-27 VITALS — BP 143/74 | HR 74 | Temp 98.1°F | Resp 18 | Ht 60.0 in | Wt 141.0 lb

## 2023-03-27 DIAGNOSIS — M810 Age-related osteoporosis without current pathological fracture: Secondary | ICD-10-CM | POA: Diagnosis not present

## 2023-03-27 MED ORDER — DIPHENHYDRAMINE HCL 25 MG PO CAPS
25.0000 mg | ORAL_CAPSULE | Freq: Once | ORAL | Status: AC
  Administered 2023-03-27: 25 mg via ORAL
  Filled 2023-03-27: qty 1

## 2023-03-27 MED ORDER — ACETAMINOPHEN 325 MG PO TABS
650.0000 mg | ORAL_TABLET | Freq: Once | ORAL | Status: AC
  Administered 2023-03-27: 650 mg via ORAL
  Filled 2023-03-27: qty 2

## 2023-03-27 MED ORDER — ZOLEDRONIC ACID 5 MG/100ML IV SOLN
5.0000 mg | Freq: Once | INTRAVENOUS | Status: AC
  Administered 2023-03-27: 5 mg via INTRAVENOUS
  Filled 2023-03-27: qty 100

## 2023-03-27 MED ORDER — SODIUM CHLORIDE 0.9 % IV SOLN: INTRAVENOUS | Status: DC

## 2023-03-27 NOTE — Progress Notes (Signed)
Diagnosis: Osteoporosis  Provider:  Chilton Greathouse MD  Procedure: IV Infusion  IV Type: Peripheral, IV Location: L Antecubital  Reclast (Zolendronic Acid), Dose: 5 mg  Infusion Start Time: 1433 pm  Infusion Stop Time: 1505 pm  Post Infusion IV Care: Observation period completed and Peripheral IV Discontinued  Discharge: Condition: Good, Destination: Home . AVS Provided  Performed by:  Forrest Moron, RN

## 2023-03-27 NOTE — Addendum Note (Signed)
Addended by: Governor Specking A on: 03/27/2023 03:47 PM   Modules accepted: Orders

## 2023-04-02 DIAGNOSIS — T148XXA Other injury of unspecified body region, initial encounter: Secondary | ICD-10-CM | POA: Diagnosis not present

## 2023-04-02 DIAGNOSIS — M25562 Pain in left knee: Secondary | ICD-10-CM | POA: Diagnosis not present

## 2023-04-02 DIAGNOSIS — K0889 Other specified disorders of teeth and supporting structures: Secondary | ICD-10-CM | POA: Diagnosis not present

## 2023-04-02 DIAGNOSIS — S62656A Nondisplaced fracture of medial phalanx of right little finger, initial encounter for closed fracture: Secondary | ICD-10-CM | POA: Diagnosis not present

## 2023-04-02 DIAGNOSIS — M79644 Pain in right finger(s): Secondary | ICD-10-CM | POA: Diagnosis not present

## 2023-04-02 DIAGNOSIS — M1712 Unilateral primary osteoarthritis, left knee: Secondary | ICD-10-CM | POA: Diagnosis not present

## 2023-04-03 ENCOUNTER — Encounter: Payer: Self-pay | Admitting: Orthopaedic Surgery

## 2023-04-03 ENCOUNTER — Ambulatory Visit (INDEPENDENT_AMBULATORY_CARE_PROVIDER_SITE_OTHER): Payer: Medicare PPO | Admitting: Orthopaedic Surgery

## 2023-04-03 VITALS — BP 130/72 | HR 92

## 2023-04-03 DIAGNOSIS — S8002XA Contusion of left knee, initial encounter: Secondary | ICD-10-CM | POA: Diagnosis not present

## 2023-04-03 DIAGNOSIS — S62626A Displaced fracture of medial phalanx of right little finger, initial encounter for closed fracture: Secondary | ICD-10-CM | POA: Diagnosis not present

## 2023-04-03 DIAGNOSIS — S8000XA Contusion of unspecified knee, initial encounter: Secondary | ICD-10-CM | POA: Insufficient documentation

## 2023-04-03 DIAGNOSIS — S62609A Fracture of unspecified phalanx of unspecified finger, initial encounter for closed fracture: Secondary | ICD-10-CM | POA: Insufficient documentation

## 2023-04-03 NOTE — Progress Notes (Signed)
Office Visit Note   Patient: Natalie Romero           Date of Birth: Dec 24, 1950           MRN: [id number] Visit Date: 04/03/2023              Requested by: Cleatis Polka., MD [address] [place],  [place] [address] PCP: Cleatis Polka., MD   Assessment & Plan: Visit Diagnoses:  1. Contusion of left knee, initial encounter   2. Closed displaced fracture of middle phalanx of right little finger, initial encounter     Plan: Patient with left knee prepatellar bursal contusion.  This should resolve.  She is using ibuprofen and Tylenol.  Splint applied right fifth finger.  Recheck 2 weeks repeat x-ray and we can start some buddy tape range of motion at that time.  Patient started having some left shoulder pain yesterday she can get her arm up overhead and if she is still having problems in 2 weeks we will obtain some left shoulder radiographs.  Follow-Up Instructions: No follow-ups on file.   Orders:  No orders of the defined types were placed in this encounter.  No orders of the defined types were placed in this encounter.     Procedures: No procedures performed   Clinical Data: No additional findings.   Subjective: Chief Complaint  Patient presents with   Right Hand - Injury   Left Knee - Pain   Left Shoulder - Pain    Injury   72 year old female walking her 2 large dogs when dog was from her brother passed away.  They spotted another dog and jerked forward she landed on her face with abrasions on her chest abrasions on her lip nose face.  She had sharp pain abrasions on her hand and also pain PIP joint small finger.  X-rays at PCP showed volar plate avulsion injury to the fifth finger.  Knee x-rays were negative for acute fracture but she did have prepatellar bursa swelling. Patient had some facial fractures and has appointment for this tomorrow.  Previously she had seen Dr.Xu for ankle injury she is on treatment for osteoporosis. Review of  Systems   Objective: Vital Signs: BP 130/72   Pulse 92   Physical Exam Constitutional:      Appearance: She is well-developed.  HENT:     Head: Normocephalic.     Comments: Abrasions over the upper lip cheekbone.    Right Ear: External ear normal.     Left Ear: External ear normal. There is no impacted cerumen.  Eyes:     Extraocular Movements: Extraocular movements intact.     Pupils: Pupils are equal, round, and reactive to light.  Neck:     Thyroid: No thyromegaly.     Trachea: No tracheal deviation.  Cardiovascular:     Rate and Rhythm: Normal rate.  Pulmonary:     Effort: Pulmonary effort is normal.  Abdominal:     Palpations: Abdomen is soft.  Musculoskeletal:     Cervical back: No rigidity.  Skin:    General: Skin is warm and dry.  Neurological:     Mental Status: She is alert and oriented to person, place, and time.  Psychiatric:        Behavior: Behavior normal.     Ortho Exam left knee prepatellar bursal swelling not tense no true knee effusion collateral ligaments are balanced.  Finger tenderness over the right fifth PIP joint with some  ecchymosis abrasions over the palm thenar hypothenar region.  Specialty Comments:  No specialty comments available.  Imaging: No results found.   PMFS History: Patient Active Problem List   Diagnosis Date Noted   Knee contusion 04/03/2023   Finger fracture, right 04/03/2023   Senile osteoporosis 02/20/2023   Hx of colonic polyps 02/17/2022   Trimalleolar fracture of ankle, closed 08/20/2013   Past Medical History:  Diagnosis Date   Dysrhythmia    Palpations   Flu 07/27/2013   GERD (gastroesophageal reflux disease)    Heart murmur    Hematuria    cleared by urologist   Hemorrhoid    Hx of colonic polyps 02/17/2022   Hypertension    Osteopenia     Family History  Problem Relation Age of Onset   Cancer Mother    Pulmonary embolism Sister    Colon cancer Neg Hx    Colon polyps Neg Hx    Rectal cancer  Neg Hx    Stomach cancer Neg Hx    Esophageal cancer Neg Hx     Past Surgical History:  Procedure Laterality Date   BREAST BIOPSY Right    BREAST EXCISIONAL BIOPSY Left 1985   BREAST EXCISIONAL BIOPSY Right 1990   BREAST SURGERY Bilateral    COLONOSCOPY W/ BIOPSIES AND POLYPECTOMY     DENTAL SURGERY     HARDWARE REMOVAL Right 09/04/2014   Procedure: HARDWARE REMOVAL RIGHT ANKLE MEDIAL MALLEOLUS SCREWS;  Surgeon: Tarry Kos, MD;  Location: [facility] OR;  Service: Orthopedics;  Laterality: Right;   HEMORRHOID BANDING     ORIF ANKLE FRACTURE Right 08/20/2013   Procedure: OPEN REDUCTION INTERNAL FIXATION (ORIF) RIGHT  ANKLE FRACTURE;  Surgeon: Cheral Almas, MD;  Location: [facility] OR;  Service: Orthopedics;  Laterality: Right;   removal of right ankle screws Right 09/04/2014   medial malleolus-2 screws   TONSILLECTOMY     Social History   Occupational History   Not on file  Tobacco Use   Smoking status: Never   Smokeless tobacco: Never  Vaping Use   Vaping status: Never Used  Substance and Sexual Activity   Alcohol use: Yes    Comment: a glass of wine once or twice a month"   Drug use: No   Sexual activity: Not on file

## 2023-04-05 DIAGNOSIS — S0511XA Contusion of eyeball and orbital tissues, right eye, initial encounter: Secondary | ICD-10-CM | POA: Diagnosis not present

## 2023-04-05 DIAGNOSIS — H35053 Retinal neovascularization, unspecified, bilateral: Secondary | ICD-10-CM | POA: Diagnosis not present

## 2023-04-05 DIAGNOSIS — H4321 Crystalline deposits in vitreous body, right eye: Secondary | ICD-10-CM | POA: Diagnosis not present

## 2023-04-24 ENCOUNTER — Other Ambulatory Visit (INDEPENDENT_AMBULATORY_CARE_PROVIDER_SITE_OTHER): Payer: Medicare PPO

## 2023-04-24 ENCOUNTER — Ambulatory Visit (INDEPENDENT_AMBULATORY_CARE_PROVIDER_SITE_OTHER): Payer: Medicare PPO | Admitting: Orthopaedic Surgery

## 2023-04-24 DIAGNOSIS — S62626D Displaced fracture of medial phalanx of right little finger, subsequent encounter for fracture with routine healing: Secondary | ICD-10-CM

## 2023-04-24 DIAGNOSIS — M2242 Chondromalacia patellae, left knee: Secondary | ICD-10-CM | POA: Diagnosis not present

## 2023-04-24 NOTE — Progress Notes (Signed)
Office Visit Note   Patient: Natalie Romero           Date of Birth: [birth year]           MRN: [id number] Visit Date: 04/24/2023              Requested by: Cleatis Polka., MD [address] [place],  [place] [address] PCP: Cleatis Polka., MD   Assessment & Plan: Visit Diagnoses:  1. Closed displaced fracture of middle phalanx of right little finger with routine healing, subsequent encounter   2. Chondromalacia patellae, left knee     Plan: We discussed Aspercreme topical anti-inflammatories isometric quad straight leg raising.  Buddy taping ring to small finger she can work on active assistive flexion extension when she has full flexion she can stop the buddy taping.  She will follow-up on an as-needed basis.  Follow-Up Instructions: Return if symptoms worsen or fail to improve.   Orders:  Orders Placed This Encounter  Procedures   XR Finger Little Right   No orders of the defined types were placed in this encounter.     Procedures: No procedures performed   Clinical Data: No additional findings.   Subjective: Chief Complaint  Patient presents with   Right Little Finger - Fracture, Follow-up    HPI patient returns to she has not been very compliant with her splint.  She has noted some pain at the MCP joint which she has been keeping straight.  States she had to do some traveling was hard to handle her suitcase laptop etc. with the splint on her finger.  She has an upcoming trip to [place] and also [employer] [place].  She is also continue to problems with her knee particular with steps has to go down the steps sideways pain with squatting.  She fell on her knee.  Review of Systems all other systems noncontributory to HPI.   Objective: Vital Signs: There were no vitals taken for this visit.  Physical Exam Constitutional:      Appearance: She is well-developed.  HENT:     Head: Normocephalic.     Right Ear: External ear normal.     Left  Ear: External ear normal. There is no impacted cerumen.  Eyes:     Pupils: Pupils are equal, round, and reactive to light.  Neck:     Thyroid: No thyromegaly.     Trachea: No tracheal deviation.  Cardiovascular:     Rate and Rhythm: Normal rate.  Pulmonary:     Effort: Pulmonary effort is normal.  Abdominal:     Palpations: Abdomen is soft.  Musculoskeletal:     Cervical back: No rigidity.  Skin:    General: Skin is warm and dry.  Neurological:     Mental Status: She is alert and oriented to person, place, and time.  Psychiatric:        Behavior: Behavior normal.     Ortho Exam positive patellar grind test worse in the left than right.  Negative subluxation negative logroll the hips.  Mild PIP swelling right ring finger.  Collateral ligaments are stable.  No effusion of the MCP joint.  Specialty Comments:  No specialty comments available.  Imaging: XR Finger Little Right  Result Date: 04/24/2023 Three-view x-rays demonstrates a volar plate avulsion injury PIP right fifth finger without subluxation or angulation. Impression: Right fifth finger PIP volar plate avulsion injury with tiny chip fracture of the proximal volar plate.  PMFS History: Patient Active Problem List   Diagnosis Date Noted   Chondromalacia patellae, left knee 04/24/2023   Knee contusion 04/03/2023   Finger fracture, right 04/03/2023   Senile osteoporosis 02/20/2023   Hx of colonic polyps 02/17/2022   Trimalleolar fracture of ankle, closed 08/20/2013   Past Medical History:  Diagnosis Date   Dysrhythmia    Palpations   Flu 07/27/2013   GERD (gastroesophageal reflux disease)    Heart murmur    Hematuria    cleared by urologist   Hemorrhoid    Hx of colonic polyps 02/17/2022   Hypertension    Osteopenia     Family History  Problem Relation Age of Onset   Cancer Mother    Pulmonary embolism Sister    Colon cancer Neg Hx    Colon polyps Neg Hx    Rectal cancer Neg Hx    Stomach cancer  Neg Hx    Esophageal cancer Neg Hx     Past Surgical History:  Procedure Laterality Date   BREAST BIOPSY Right    BREAST EXCISIONAL BIOPSY Left 1985   BREAST EXCISIONAL BIOPSY Right 1990   BREAST SURGERY Bilateral    COLONOSCOPY W/ BIOPSIES AND POLYPECTOMY     DENTAL SURGERY     HARDWARE REMOVAL Right 09/04/2014   Procedure: HARDWARE REMOVAL RIGHT ANKLE MEDIAL MALLEOLUS SCREWS;  Surgeon: Tarry Kos, MD;  Location: [facility] OR;  Service: Orthopedics;  Laterality: Right;   HEMORRHOID BANDING     ORIF ANKLE FRACTURE Right 08/20/2013   Procedure: OPEN REDUCTION INTERNAL FIXATION (ORIF) RIGHT  ANKLE FRACTURE;  Surgeon: Cheral Almas, MD;  Location: [facility] OR;  Service: Orthopedics;  Laterality: Right;   removal of right ankle screws Right 09/04/2014   medial malleolus-2 screws   TONSILLECTOMY     Social History   Occupational History   Not on file  Tobacco Use   Smoking status: Never   Smokeless tobacco: Never  Vaping Use   Vaping status: Never Used  Substance and Sexual Activity   Alcohol use: Yes    Comment: a glass of wine once or twice a month"   Drug use: No   Sexual activity: Not on file

## 2023-05-14 ENCOUNTER — Other Ambulatory Visit (HOSPITAL_BASED_OUTPATIENT_CLINIC_OR_DEPARTMENT_OTHER): Payer: Self-pay | Admitting: Registered Nurse

## 2023-05-14 ENCOUNTER — Ambulatory Visit (HOSPITAL_BASED_OUTPATIENT_CLINIC_OR_DEPARTMENT_OTHER)
Admission: RE | Admit: 2023-05-14 | Discharge: 2023-05-14 | Disposition: A | Discharge status: Home / Self Care | Payer: Medicare PPO | Source: Ambulatory Visit | Attending: Registered Nurse | Admitting: Registered Nurse

## 2023-05-14 DIAGNOSIS — S0232XA Fracture of orbital floor, left side, initial encounter for closed fracture: Secondary | ICD-10-CM | POA: Diagnosis not present

## 2023-05-14 DIAGNOSIS — W19XXXA Unspecified fall, initial encounter: Secondary | ICD-10-CM | POA: Diagnosis not present

## 2023-05-14 DIAGNOSIS — R220 Localized swelling, mass and lump, head: Secondary | ICD-10-CM | POA: Diagnosis not present

## 2023-05-14 DIAGNOSIS — R519 Headache, unspecified: Secondary | ICD-10-CM

## 2023-05-14 DIAGNOSIS — S0230XA Fracture of orbital floor, unspecified side, initial encounter for closed fracture: Secondary | ICD-10-CM | POA: Diagnosis not present

## 2023-05-14 DIAGNOSIS — M79642 Pain in left hand: Secondary | ICD-10-CM | POA: Diagnosis not present

## 2023-05-14 DIAGNOSIS — R200 Anesthesia of skin: Secondary | ICD-10-CM | POA: Diagnosis not present

## 2023-05-14 DIAGNOSIS — S62307A Unspecified fracture of fifth metacarpal bone, left hand, initial encounter for closed fracture: Secondary | ICD-10-CM | POA: Diagnosis not present

## 2023-05-15 DIAGNOSIS — S0285XD Fracture of orbit, unspecified, subsequent encounter for fracture with routine healing: Secondary | ICD-10-CM | POA: Diagnosis not present

## 2023-05-17 DIAGNOSIS — S0230XB Fracture of orbital floor, unspecified side, initial encounter for open fracture: Secondary | ICD-10-CM | POA: Diagnosis not present

## 2023-05-17 DIAGNOSIS — S6992XA Unspecified injury of left wrist, hand and finger(s), initial encounter: Secondary | ICD-10-CM | POA: Diagnosis not present

## 2023-05-17 DIAGNOSIS — S0511XA Contusion of eyeball and orbital tissues, right eye, initial encounter: Secondary | ICD-10-CM | POA: Diagnosis not present

## 2023-05-17 DIAGNOSIS — H35053 Retinal neovascularization, unspecified, bilateral: Secondary | ICD-10-CM | POA: Diagnosis not present

## 2023-05-17 DIAGNOSIS — H4321 Crystalline deposits in vitreous body, right eye: Secondary | ICD-10-CM | POA: Diagnosis not present

## 2023-05-17 DIAGNOSIS — S0512XA Contusion of eyeball and orbital tissues, left eye, initial encounter: Secondary | ICD-10-CM | POA: Diagnosis not present

## 2023-05-18 DIAGNOSIS — M25642 Stiffness of left hand, not elsewhere classified: Secondary | ICD-10-CM | POA: Diagnosis not present

## 2023-05-18 DIAGNOSIS — M79645 Pain in left finger(s): Secondary | ICD-10-CM | POA: Diagnosis not present

## 2023-06-14 DIAGNOSIS — S6992XD Unspecified injury of left wrist, hand and finger(s), subsequent encounter: Secondary | ICD-10-CM | POA: Diagnosis not present

## 2023-10-16 DIAGNOSIS — R0981 Nasal congestion: Secondary | ICD-10-CM | POA: Diagnosis not present

## 2023-10-16 DIAGNOSIS — J209 Acute bronchitis, unspecified: Secondary | ICD-10-CM | POA: Diagnosis not present

## 2023-10-16 DIAGNOSIS — R058 Other specified cough: Secondary | ICD-10-CM | POA: Diagnosis not present

## 2023-10-16 DIAGNOSIS — R509 Fever, unspecified: Secondary | ICD-10-CM | POA: Diagnosis not present

## 2023-10-16 DIAGNOSIS — Z1152 Encounter for screening for COVID-19: Secondary | ICD-10-CM | POA: Diagnosis not present

## 2023-10-16 DIAGNOSIS — I10 Essential (primary) hypertension: Secondary | ICD-10-CM | POA: Diagnosis not present

## 2023-11-06 DIAGNOSIS — M810 Age-related osteoporosis without current pathological fracture: Secondary | ICD-10-CM | POA: Diagnosis not present

## 2023-11-06 DIAGNOSIS — I10 Essential (primary) hypertension: Secondary | ICD-10-CM | POA: Diagnosis not present

## 2023-11-06 DIAGNOSIS — E785 Hyperlipidemia, unspecified: Secondary | ICD-10-CM | POA: Diagnosis not present

## 2023-11-06 DIAGNOSIS — R7301 Impaired fasting glucose: Secondary | ICD-10-CM | POA: Diagnosis not present

## 2023-11-13 DIAGNOSIS — Z1331 Encounter for screening for depression: Secondary | ICD-10-CM | POA: Diagnosis not present

## 2023-11-13 DIAGNOSIS — Z0000 Encounter for general adult medical examination without abnormal findings: Secondary | ICD-10-CM | POA: Diagnosis not present

## 2023-11-13 DIAGNOSIS — E785 Hyperlipidemia, unspecified: Secondary | ICD-10-CM | POA: Diagnosis not present

## 2023-11-13 DIAGNOSIS — R7301 Impaired fasting glucose: Secondary | ICD-10-CM | POA: Diagnosis not present

## 2023-11-13 DIAGNOSIS — M810 Age-related osteoporosis without current pathological fracture: Secondary | ICD-10-CM | POA: Diagnosis not present

## 2023-11-13 DIAGNOSIS — Z1339 Encounter for screening examination for other mental health and behavioral disorders: Secondary | ICD-10-CM | POA: Diagnosis not present

## 2023-11-13 DIAGNOSIS — I10 Essential (primary) hypertension: Secondary | ICD-10-CM | POA: Diagnosis not present

## 2023-11-13 DIAGNOSIS — R82998 Other abnormal findings in urine: Secondary | ICD-10-CM | POA: Diagnosis not present

## 2023-11-13 DIAGNOSIS — Z860101 Personal history of adenomatous and serrated colon polyps: Secondary | ICD-10-CM | POA: Diagnosis not present

## 2024-01-01 DIAGNOSIS — D1801 Hemangioma of skin and subcutaneous tissue: Secondary | ICD-10-CM | POA: Diagnosis not present

## 2024-01-01 DIAGNOSIS — L821 Other seborrheic keratosis: Secondary | ICD-10-CM | POA: Diagnosis not present

## 2024-01-01 DIAGNOSIS — L578 Other skin changes due to chronic exposure to nonionizing radiation: Secondary | ICD-10-CM | POA: Diagnosis not present

## 2024-01-01 DIAGNOSIS — L814 Other melanin hyperpigmentation: Secondary | ICD-10-CM | POA: Diagnosis not present

## 2024-01-01 DIAGNOSIS — L740 Miliaria rubra: Secondary | ICD-10-CM | POA: Diagnosis not present

## 2024-01-01 DIAGNOSIS — D229 Melanocytic nevi, unspecified: Secondary | ICD-10-CM | POA: Diagnosis not present

## 2024-01-02 DIAGNOSIS — Z1231 Encounter for screening mammogram for malignant neoplasm of breast: Secondary | ICD-10-CM | POA: Diagnosis not present

## 2024-01-02 DIAGNOSIS — Z01419 Encounter for gynecological examination (general) (routine) without abnormal findings: Secondary | ICD-10-CM | POA: Diagnosis not present

## 2024-01-02 DIAGNOSIS — Z6827 Body mass index (BMI) 27.0-27.9, adult: Secondary | ICD-10-CM | POA: Diagnosis not present

## 2024-01-03 DIAGNOSIS — S0511XA Contusion of eyeball and orbital tissues, right eye, initial encounter: Secondary | ICD-10-CM | POA: Diagnosis not present

## 2024-01-03 DIAGNOSIS — S0512XA Contusion of eyeball and orbital tissues, left eye, initial encounter: Secondary | ICD-10-CM | POA: Diagnosis not present

## 2024-01-03 DIAGNOSIS — H4321 Crystalline deposits in vitreous body, right eye: Secondary | ICD-10-CM | POA: Diagnosis not present

## 2024-01-03 DIAGNOSIS — S0230XB Fracture of orbital floor, unspecified side, initial encounter for open fracture: Secondary | ICD-10-CM | POA: Diagnosis not present

## 2024-01-03 DIAGNOSIS — H35053 Retinal neovascularization, unspecified, bilateral: Secondary | ICD-10-CM | POA: Diagnosis not present

## 2024-02-15 DIAGNOSIS — H524 Presbyopia: Secondary | ICD-10-CM | POA: Diagnosis not present

## 2024-02-15 DIAGNOSIS — H52202 Unspecified astigmatism, left eye: Secondary | ICD-10-CM | POA: Diagnosis not present

## 2024-02-15 DIAGNOSIS — H5202 Hypermetropia, left eye: Secondary | ICD-10-CM | POA: Diagnosis not present

## 2024-02-28 DIAGNOSIS — M810 Age-related osteoporosis without current pathological fracture: Secondary | ICD-10-CM | POA: Diagnosis not present

## 2024-03-27 ENCOUNTER — Telehealth: Payer: Self-pay | Admitting: Internal Medicine

## 2024-03-27 ENCOUNTER — Telehealth: Payer: Self-pay

## 2024-03-27 ENCOUNTER — Other Ambulatory Visit (HOSPITAL_COMMUNITY): Payer: Self-pay | Admitting: Pharmacy Technician

## 2024-03-27 NOTE — Telephone Encounter (Signed)
 Dr. Latisha, patient will be scheduled as soon as possible.  Auth Submission: NO AUTH NEEDED Site of care: Site of care: [facility] Payer: [employer] medicare Medication & CPT/J Code(s) submitted: Reclast  (Zolendronic acid) [id number] Diagnosis Code:  Route of submission (phone, fax, portal): portal Phone # Fax # Auth type: Buy/Bill PB Units/visits requested: 5mg  x 1 dose Reference number:  Approval from: 03/27/24 to 06/25/24

## 2024-03-27 NOTE — Telephone Encounter (Signed)
 Received a call from patient stating she is having some ulcer symptoms, she is requesting nurse fu before her scheduled appt 11/13. Please review and advise    Thank you

## 2024-03-27 NOTE — Telephone Encounter (Signed)
 The pt states that she is out of town and reports black tarry stools.  She is worried about an ulcer.  She has an appt in Nov but has been given an appt at 130 pm tomorrow with Camie that has opened up. She is aware to go to the ED or UC in the meantime. She will be traveling back to [facility]

## 2024-03-28 ENCOUNTER — Ambulatory Visit: Admitting: Gastroenterology

## 2024-03-28 ENCOUNTER — Ambulatory Visit: Payer: Self-pay | Admitting: Gastroenterology

## 2024-03-28 ENCOUNTER — Other Ambulatory Visit

## 2024-03-28 ENCOUNTER — Encounter: Payer: Self-pay | Admitting: Gastroenterology

## 2024-03-28 VITALS — BP 136/72 | HR 78 | Ht 60.0 in | Wt 136.0 lb

## 2024-03-28 DIAGNOSIS — K219 Gastro-esophageal reflux disease without esophagitis: Secondary | ICD-10-CM | POA: Diagnosis not present

## 2024-03-28 DIAGNOSIS — K921 Melena: Secondary | ICD-10-CM | POA: Diagnosis not present

## 2024-03-28 DIAGNOSIS — R1013 Epigastric pain: Secondary | ICD-10-CM | POA: Diagnosis not present

## 2024-03-28 DIAGNOSIS — R195 Other fecal abnormalities: Secondary | ICD-10-CM

## 2024-03-28 LAB — COMPREHENSIVE METABOLIC PANEL WITH GFR
ALT: 18 U/L (ref 0–35)
AST: 22 U/L (ref 0–37)
Albumin: 4.6 g/dL (ref 3.5–5.2)
Alkaline Phosphatase: 80 U/L (ref 39–117)
BUN: 15 mg/dL (ref 6–23)
CO2: 30 meq/L (ref 19–32)
Calcium: 9.8 mg/dL (ref 8.4–10.5)
Chloride: 101 meq/L (ref 96–112)
Creatinine, Ser: 0.93 mg/dL (ref 0.40–1.20)
GFR: 62.13 mL/min (ref >60.00)
Glucose, Bld: 119 mg/dL — ABNORMAL HIGH (ref 70–99)
Potassium: 3.6 meq/L (ref 3.5–5.1)
Sodium: 140 meq/L (ref 135–145)
Total Bilirubin: 0.6 mg/dL (ref 0.2–1.2)
Total Protein: 7.4 g/dL (ref 6.0–8.3)

## 2024-03-28 LAB — CBC WITH DIFFERENTIAL/PLATELET
Basophils Absolute: 0 K/uL (ref 0.0–0.1)
Basophils Relative: 0.2 % (ref 0.0–3.0)
Eosinophils Absolute: 0.1 K/uL (ref 0.0–0.7)
Eosinophils Relative: 1.2 % (ref 0.0–5.0)
HCT: 39.6 % (ref 36.0–46.0)
Hemoglobin: 13.3 g/dL (ref 12.0–15.0)
Lymphocytes Relative: 30.6 % (ref 12.0–46.0)
Lymphs Abs: 2.2 K/uL (ref 0.7–4.0)
MCHC: 33.4 g/dL (ref 30.0–36.0)
MCV: 87.7 fl (ref 78.0–100.0)
Monocytes Absolute: 0.7 K/uL (ref 0.1–1.0)
Monocytes Relative: 10 % (ref 3.0–12.0)
Neutro Abs: 4.1 K/uL (ref 1.4–7.7)
Neutrophils Relative %: 58 % (ref 43.0–77.0)
Platelets: 370 K/uL (ref 150.0–400.0)
RBC: 4.52 Mil/uL (ref 3.87–5.11)
RDW: 12.9 % (ref 11.5–15.5)
WBC: 7.1 K/uL (ref 4.0–10.5)

## 2024-03-28 MED ORDER — PANTOPRAZOLE SODIUM 40 MG PO TBEC: 40.0000 mg | DELAYED_RELEASE_TABLET | Freq: Two times a day (BID) | ORAL | 3 refills | Status: DC

## 2024-03-28 NOTE — Patient Instructions (Signed)
 Your provider has requested that you go to the basement level for lab work before leaving today. Press B on the elevator. The lab is located at the first door on the left as you exit the elevator.  Due to recent changes in healthcare laws, you may see the results of your imaging and laboratory studies on MyChart before your provider has had a chance to review them.  We understand that in some cases there may be results that are confusing or concerning to you. Not all laboratory results come back in the same time frame and the provider may be waiting for multiple results in order to interpret others.  Please give us  48 hours in order for your provider to thoroughly review all the results before contacting the office for clarification of your results.    We have sent the following medications to your pharmacy for you to pick up at your convenience: Protonix 40 mg (pantoprazole) twice a day.  Stop NSAIDs, including aspirin .  Please go to the ER for any new or worsening symptoms.   You have been scheduled for an endoscopy.  Please follow the written instructions given to you at your visit today.  If you use inhalers (even only as needed), please bring them with you on the day of your procedure.  DO NOT TAKE 7 DAYS PRIOR TO TEST- Trulicity (dulaglutide) Ozempic, Wegovy (semaglutide) Mounjaro (tirzepatide) Bydureon Bcise (exanatide extended release)  DO NOT TAKE 1 DAY PRIOR TO YOUR TEST Rybelsus (semaglutide) Adlyxin (lixisenatide) Victoza (liraglutide) Byetta (exanatide) ___________________________________________________________________________  Thank you for trusting me with your gastrointestinal care!   Camie Furbish, PA-C  _______________________________________________________  If your blood pressure at your visit was 140/90 or greater, please contact your primary care physician to follow up on this.  _______________________________________________________  If you are age 105 or  older, your body mass index should be between 23-30. Your Body mass index is 26.56 kg/m. If this is out of the aforementioned range listed, please consider follow up with your Primary Care Provider.  If you are age 16 or younger, your body mass index should be between 19-25. Your Body mass index is 26.56 kg/m. If this is out of the aformentioned range listed, please consider follow up with your Primary Care Provider.   ________________________________________________________  The [facility] providers would like to encourage you to use MYCHART to communicate with providers for non-urgent requests or questions.  Due to long hold times on the telephone, sending your provider a message by [facility] may be a faster and more efficient way to get a response.  Please allow 48 business hours for a response.  Please remember that this is for non-urgent requests.  _______________________________________________________  [facility] is using a team-based approach to care.  Your team is made up of your doctor and two to three APPS. Our APPS (Nurse Practitioners and Physician Assistants) work with your physician to ensure care continuity for you. They are fully qualified to address your health concerns and develop a treatment plan. They communicate directly with your gastroenterologist to care for you. Seeing the Advanced Practice Practitioners on your physician's team can help you by facilitating care more promptly, often allowing for earlier appointments, access to diagnostic testing, procedures, and other specialty referrals.

## 2024-03-28 NOTE — Progress Notes (Signed)
 Natalie Romero [id number] 1950-11-05   Chief Complaint: Melena  Referring Provider: Loreli Elsie JONETTA Mickey., MD Primary GI MD: Dr. Avram  HPI: Natalie Romero is a 73 y.o. female with past medical history of GERD, heart palpitations, colon polyps, hemorrhoids s/p banding, HTN, HLD, osteopenia who presents today for a complaint of black tarry stools.    Colonoscopy 02/17/2022 was normal, and 5-year recall recommended due to history of colon polyps on colonoscopy 2006 and 2013.  Had a visit with PCP 11/13/2023.  Annual visit.  Labs 11/06/2023: Normal CMP, normal CBC with hemoglobin 14.6, high cholesterol, normal TSH, normal vitamin D , A1c 5.8.  Called 03/27/2024 reporting she was out of town and was having black tarry stools.  Concerned about an ulcer.  Advised to go to the ED or urgent care in the meantime prior to appointment.   Patient states she took 2 aspirin  on Monday, traveled to [place] to stay for work.  Started to notice some epigastric discomfort.  Experienced griping epigastric pain all day Wednesday and Thursday.  During this time was having normal bowel movements, but yesterday had a formed stool which was black.  Today she has also had 2 bowel movements with formed, black stools.  Denies use of iron supplements or Pepto-Bismol.  States she picked up some OTC Pepcid  and this has improved her epigastric pain, though pain is still present.  Denies any nausea, vomiting, fever, chills, weakness, lightheadedness, chest pain, shortness of breath.  Denies constipation or diarrhea.  Not seeing any bright red blood in her stool.  Bowel movements are still regular, only noting a change in the color of her stool.  Does take NSAIDs on a fairly regular basis, maybe once a week.  Does not always take with food necessarily, but does snack throughout the day and usually has something on her stomach.  Denies alcohol  or smoking.  Has occasional heartburn for which she takes Tums as needed but this  does not occur weekly.  Has never seen dark stools in the past and it is concerning to her.  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  States she is planning to travel back to [place] for work on Monday.  Previous GI Procedures/Imaging   Colonoscopy 02/17/2022 - The examination was otherwise normal on direct and retroflexion views.  - No specimens collected.  - Personal history of colonic polyps. 7 mm rectal adenoma 2006, 12 mm serrated adenoma 2013 - Recall 5 years  Past Medical History:  Diagnosis Date   Dysrhythmia    Palpations   Flu 07/27/2013   GERD (gastroesophageal reflux disease)    Heart murmur    Hematuria    cleared by urologist   Hemorrhoid    Hx of colonic polyps 02/17/2022   Hypertension    Osteopenia     Past Surgical History:  Procedure Laterality Date   BREAST BIOPSY Right    BREAST EXCISIONAL BIOPSY Left 1985   BREAST EXCISIONAL BIOPSY Right 1990   BREAST SURGERY Bilateral    COLONOSCOPY W/ BIOPSIES AND POLYPECTOMY     DENTAL SURGERY     HARDWARE REMOVAL Right 09/04/2014   Procedure: HARDWARE REMOVAL RIGHT ANKLE MEDIAL MALLEOLUS SCREWS;  Surgeon: Kay CHRISTELLA Cummins, MD;  Location: [facility] OR;  Service: Orthopedics;  Laterality: Right;   HEMORRHOID BANDING     ORIF ANKLE FRACTURE Right 08/20/2013   Procedure: OPEN REDUCTION INTERNAL FIXATION (ORIF) RIGHT  ANKLE FRACTURE;  Surgeon: Kay Ozell Cummins, MD;  Location: [facility] OR;  Service: Orthopedics;  Laterality: Right;   removal of right ankle screws Right 09/04/2014   medial malleolus-2 screws   TONSILLECTOMY      Current Outpatient Medications  Medication Sig Dispense Refill   acetaminophen  (TYLENOL ) 500 MG tablet Take 500 mg by mouth every 6 (six) hours as needed.     hydrochlorothiazide (HYDRODIURIL) 12.5 MG tablet Take 12.5 mg by mouth every morning.     ibuprofen (ADVIL,MOTRIN) 200 MG tablet Take 600 mg by mouth every 8 (eight) hours as needed for moderate pain.     polyethylene glycol  powder (GLYCOLAX /MIRALAX ) powder Take 1 Container by mouth 2 (two) times daily as needed (for constipation due to pain medication).     No current facility-administered medications for this visit.    Allergies as of 03/28/2024 - Review Complete 03/28/2024  Allergen Reaction Noted   Oxycodone  Nausea Only 09/04/2014    Family History  Problem Relation Age of Onset   Cancer Mother    Pulmonary embolism Sister    Colon cancer Neg Hx    Colon polyps Neg Hx    Rectal cancer Neg Hx    Stomach cancer Neg Hx    Esophageal cancer Neg Hx     Social History   Tobacco Use   Smoking status: Never   Smokeless tobacco: Never  Vaping Use   Vaping status: Never Used  Substance Use Topics   Alcohol  use: Yes    Comment: a glass of wine once or twice a month   Drug use: No     Review of Systems:    Constitutional: No fever, chills, weakness  Cardiovascular: No chest pain Respiratory: No SOB  Gastrointestinal: See HPI and otherwise negative    Physical Exam:  Vital signs: BP 136/72   Pulse 78   Ht 5' (1.524 m)   Wt 136 lb (61.7 kg)   BMI 26.56 kg/m   Constitutional: Pleasant, well-appearing female in NAD, alert and cooperative Head:  Normocephalic and atraumatic.  Eyes: No scleral icterus.  Respiratory: Respirations even and unlabored. Lungs clear to auscultation bilaterally.  No wheezes, crackles, or rhonchi.  Cardiovascular:  Regular rate and rhythm. No murmurs. No peripheral edema. Gastrointestinal:  Soft, nondistended, minimal tenderness to palpation of epigastrium. No rebound or guarding. Normal bowel sounds. No appreciable masses or hepatomegaly. Rectal: No hemorrhoids or fissures.  Soft, dark brown stool in rectum which is heme positive.  Chaperone present for exam. Neurologic:  Alert and oriented x4;  grossly normal neurologically.  Skin:   Dry and intact without significant lesions or rashes. Psychiatric: Oriented to person, place and time. Demonstrates good judgement  and reason without abnormal affect or behaviors.   RELEVANT LABS AND IMAGING:  Outside labs 11/06/2023: Normal CMP, normal CBC with hemoglobin 14.6, high cholesterol, normal TSH, normal vitamin D , A1c 5.8.  Assessment/Plan:   Melena Epigastric pain NSAID use Patient seen today for evaluation of epigastric pain and melena.  Has history of NSAID use and takes ibuprofen about once a week, and took 2 aspirin  on Monday.  Was traveling to [facility] for work and started having epigastric discomfort which progressed to pain on Wednesday and Thursday of this week.  Yesterday passed black stool, and had 2 bowel movements today with black stools as well.  Stools are formed. No change in stool caliber or frequency.  She denies any bright red rectal bleeding.  Has been taking some OTC Pepcid  which has improved her epigastric pain.  Mildly tender  to palpation of epigastrium on exam and on rectal exam does have dark brown stool in the rectum which is heme positive. Last colonoscopy 01/2022 was normal with recommended recall in 5 years due to history of polyps. No prior EGD. Has occasional mild heartburn for which she takes Tums as needed.   Stat labs ordered which showed a normal CBC with hemoglobin 13.3 (14.6 in May), unremarkable CMP. We were able to get her scheduled for EGD 04/01/2024.  Advised patient to avoid travel until we can evaluate with EGD, discontinue all NSAIDs, start Protonix 40 mg twice daily, monitor for worsening symptoms and go to the ED if she develops loose black stools/increased frequency of bowel movements with black stools, weakness, lightheadedness, chest pain, shortness of breath, hematemesis, severe abdominal pain, etc.   Will have her come back in for repeat CBC, BMP on Monday.  - Stat CBC, CMP (completed) - Start Protonix 40 mg BID - Stop NSAIDs  - Plan for EGD 04/01/2024. I thoroughly discussed the procedure with the patient to include nature of the procedure, alternatives,  benefits, and risks (including but not limited to bleeding, infection, perforation, anesthesia/cardiac/pulmonary complications). Patient verbalized understanding and gave verbal consent to proceed with procedure.  - ED precautions given   Case and plan discussed with Dr. Avram in office today.   Camie Furbish, PA-C [facility] 03/28/2024, 1:32 PM  Patient Care Team: Loreli Elsie JONETTA Mickey., MD as PCP - General (Internal Medicine) [place], Andrez SAUNDERS, PA-C (Inactive) as Physician Assistant (Dermatology)

## 2024-03-31 ENCOUNTER — Other Ambulatory Visit (INDEPENDENT_AMBULATORY_CARE_PROVIDER_SITE_OTHER)

## 2024-03-31 DIAGNOSIS — K219 Gastro-esophageal reflux disease without esophagitis: Secondary | ICD-10-CM | POA: Diagnosis not present

## 2024-03-31 DIAGNOSIS — K921 Melena: Secondary | ICD-10-CM | POA: Diagnosis not present

## 2024-03-31 DIAGNOSIS — R1013 Epigastric pain: Secondary | ICD-10-CM

## 2024-03-31 DIAGNOSIS — R195 Other fecal abnormalities: Secondary | ICD-10-CM

## 2024-03-31 LAB — CBC WITH DIFFERENTIAL/PLATELET
Basophils Absolute: 0 K/uL (ref 0.0–0.1)
Basophils Relative: 0.7 % (ref 0.0–3.0)
Eosinophils Absolute: 0.1 K/uL (ref 0.0–0.7)
Eosinophils Relative: 1 % (ref 0.0–5.0)
HCT: 39.5 % (ref 36.0–46.0)
Hemoglobin: 13.4 g/dL (ref 12.0–15.0)
Lymphocytes Relative: 27.4 % (ref 12.0–46.0)
Lymphs Abs: 1.9 K/uL (ref 0.7–4.0)
MCHC: 33.9 g/dL (ref 30.0–36.0)
MCV: 88.5 fl (ref 78.0–100.0)
Monocytes Absolute: 0.6 K/uL (ref 0.1–1.0)
Monocytes Relative: 8.5 % (ref 3.0–12.0)
Neutro Abs: 4.4 K/uL (ref 1.4–7.7)
Neutrophils Relative %: 62.4 % (ref 43.0–77.0)
Platelets: 387 K/uL (ref 150.0–400.0)
RBC: 4.46 Mil/uL (ref 3.87–5.11)
RDW: 12.4 % (ref 11.5–15.5)
WBC: 7 K/uL (ref 4.0–10.5)

## 2024-04-01 ENCOUNTER — Ambulatory Visit: Payer: Self-pay | Admitting: Gastroenterology

## 2024-04-01 ENCOUNTER — Ambulatory Visit: Admitting: Internal Medicine

## 2024-04-01 ENCOUNTER — Encounter: Payer: Self-pay | Admitting: Internal Medicine

## 2024-04-01 VITALS — BP 108/69 | HR 78 | Temp 97.0°F | Resp 14 | Ht 60.0 in | Wt 136.0 lb

## 2024-04-01 DIAGNOSIS — K3189 Other diseases of stomach and duodenum: Secondary | ICD-10-CM

## 2024-04-01 DIAGNOSIS — K449 Diaphragmatic hernia without obstruction or gangrene: Secondary | ICD-10-CM | POA: Diagnosis not present

## 2024-04-01 DIAGNOSIS — K2950 Unspecified chronic gastritis without bleeding: Secondary | ICD-10-CM | POA: Diagnosis not present

## 2024-04-01 DIAGNOSIS — K921 Melena: Secondary | ICD-10-CM | POA: Diagnosis not present

## 2024-04-01 DIAGNOSIS — K2951 Unspecified chronic gastritis with bleeding: Secondary | ICD-10-CM | POA: Diagnosis not present

## 2024-04-01 DIAGNOSIS — R1013 Epigastric pain: Secondary | ICD-10-CM | POA: Diagnosis not present

## 2024-04-01 DIAGNOSIS — K219 Gastro-esophageal reflux disease without esophagitis: Secondary | ICD-10-CM

## 2024-04-01 DIAGNOSIS — K222 Esophageal obstruction: Secondary | ICD-10-CM | POA: Diagnosis not present

## 2024-04-01 DIAGNOSIS — K317 Polyp of stomach and duodenum: Secondary | ICD-10-CM | POA: Diagnosis not present

## 2024-04-01 DIAGNOSIS — K259 Gastric ulcer, unspecified as acute or chronic, without hemorrhage or perforation: Secondary | ICD-10-CM

## 2024-04-01 LAB — BASIC METABOLIC PANEL WITH GFR
BUN: 16 mg/dL (ref 6–23)
CO2: 29 meq/L (ref 19–32)
Calcium: 9.8 mg/dL (ref 8.4–10.5)
Chloride: 99 meq/L (ref 96–112)
Creatinine, Ser: 0.97 mg/dL (ref 0.40–1.20)
GFR: 59.06 mL/min — ABNORMAL LOW (ref >60.00)
Glucose, Bld: 117 mg/dL — ABNORMAL HIGH (ref 70–99)
Potassium: 3.3 meq/L — ABNORMAL LOW (ref 3.5–5.1)
Sodium: 141 meq/L (ref 135–145)

## 2024-04-01 MED ORDER — SODIUM CHLORIDE 0.9 % IV SOLN: 500.0000 mL | Freq: Once | INTRAVENOUS | Status: DC

## 2024-04-01 NOTE — Patient Instructions (Addendum)
 There is a stomach ulcer.  It is not bleeding right now.  It is probably from bread or ibuprofen.  I took some biopsies of that plus some tiny polyps of the stomach that look innocent.  There was also some scar tissue or a stricture where the esophagus and stomach meet which looks benign I biopsied that the sample at an opening up to reduce the risk of any swallowing problems.   You can use Tylenol  for pain but do not use aspirin  ibuprofen Aleve or any of those type of things.  Please stay on the pantoprazole.  I appreciate the opportunity to care for you. Lupita CHARLENA Commander, MD, [facility]  Thank you for letting us  care for your healthcare needs today! Please see handout regarding Hiatal Hernia. Resume previous diet. Continue present medications. Avoid aspirin  and NSAIDs. Await pathology.  YOU HAD AN ENDOSCOPIC PROCEDURE TODAY AT THE [facility]:   Refer to the procedure report that was given to you for any specific questions about what was found during the examination.  If the procedure report does not answer your questions, please call your gastroenterologist to clarify.  If you requested that your care partner not be given the details of your procedure findings, then the procedure report has been included in a sealed envelope for you to review at your convenience later.  YOU SHOULD EXPECT: Some feelings of bloating in the abdomen. Passage of more gas than usual.  Walking can help get rid of the air that was put into your GI tract during the procedure and reduce the bloating. If you had a lower endoscopy (such as a colonoscopy or flexible sigmoidoscopy) you may notice spotting of blood in your stool or on the toilet paper. If you underwent a bowel prep for your procedure, you may not have a normal bowel movement for a few days.  Please Note:  You might notice some irritation and congestion in your nose or some drainage.  This is from the oxygen used during your procedure.  There is no need for  concern and it should clear up in a day or so.  SYMPTOMS TO REPORT IMMEDIATELY:  Following upper endoscopy (EGD)  Vomiting of blood or coffee ground material  New chest pain or pain under the shoulder blades  Painful or persistently difficult swallowing  New shortness of breath  Fever of 100F or higher  Black, tarry-looking stools  For urgent or emergent issues, a gastroenterologist can be reached at any hour by calling [phone]. Do not use MyChart messaging for urgent concerns.    DIET:  We do recommend a small meal at first, but then you may proceed to your regular diet.  Drink plenty of fluids but you should avoid alcoholic beverages for 24 hours.  ACTIVITY:  You should plan to take it easy for the rest of today and you should NOT DRIVE or use heavy machinery until tomorrow (because of the sedation medicines used during the test).    FOLLOW UP: Our staff will call the number listed on your records the next business day following your procedure.  We will call around 7:15- 8:00 am to check on you and address any questions or concerns that you may have regarding the information given to you following your procedure. If we do not reach you, we will leave a message.     If any biopsies were taken you will be contacted by phone or by letter within the next 1-3 weeks.  Please  call us  at [phone] if you have not heard about the biopsies in 3 weeks.    SIGNATURES/CONFIDENTIALITY: You and/or your care partner have signed paperwork which will be entered into your electronic medical record.  These signatures attest to the fact that that the information above on your After Visit Summary has been reviewed and is understood.  Full responsibility of the confidentiality of this discharge information lies with you and/or your care-partner.

## 2024-04-01 NOTE — Progress Notes (Signed)
 History and Physical Interval Note:  04/01/2024 1:35 PM  Natalie Romero  has presented today for endoscopic procedure(s), with the diagnosis of  Encounter Diagnoses  Name Primary?   Melena Yes   Gastroesophageal reflux disease, unspecified whether esophagitis present    Abdominal pain, epigastric   .  The various methods of evaluation and treatment have been discussed with the patient and/or family. After consideration of risks, benefits and other options for treatment, the patient has consented to  the endoscopic procedure(s).   The patient's history has been reviewed, patient examined, no change in status, stable for endoscopic procedure(s).  I have reviewed the patient's chart and labs.  Questions were answered to the patient's satisfaction.     Lupita CHARLENA Commander, MD, [facility]

## 2024-04-01 NOTE — Progress Notes (Signed)
 Pt's states no medical or surgical changes since previsit or office visit.

## 2024-04-01 NOTE — Progress Notes (Signed)
 Called to room to assist during endoscopic procedure.  Patient ID and intended procedure confirmed with present staff. Received instructions for my participation in the procedure from the performing physician.

## 2024-04-01 NOTE — Progress Notes (Signed)
 A/o x 3, VSS, good SR's, pleased with anesthesia, report to RN

## 2024-04-01 NOTE — Op Note (Signed)
 [facility] Patient Name: Natalie Romero Procedure Date: 04/01/2024 1:31 PM MRN: [id number] Endoscopist: Lupita FORBES Commander , MD, [id number] Age: 73 Referring MD:  Date of Birth: February 22, 1951 Gender: Female Account #: [id number] Procedure:                Upper GI endoscopy Indications:              Abdominal pain in the left upper quadrant,                            Suspected esophageal reflux, Melena Medicines:                Monitored Anesthesia Care Procedure:                Pre-Anesthesia Assessment:                           - Prior to the procedure, a History and Physical                            was performed, and patient medications and                            allergies were reviewed. The patient's tolerance of                            previous anesthesia was also reviewed. The risks                            and benefits of the procedure and the sedation                            options and risks were discussed with the patient.                            All questions were answered, and informed consent                            was obtained. Prior Anticoagulants: The patient has                            taken no anticoagulant or antiplatelet agents. ASA                            Grade Assessment: II - A patient with mild systemic                            disease. After reviewing the risks and benefits,                            the patient was deemed in satisfactory condition to                            undergo the procedure.  After obtaining informed consent, the endoscope was                            passed under direct vision. Throughout the                            procedure, the patient's blood pressure, pulse, and                            oxygen saturations were monitored continuously. The                            Endoscope was introduced through the mouth, and                            advanced to the second part  of duodenum. The upper                            GI endoscopy was accomplished without difficulty.                            The patient tolerated the procedure well. Scope In: Scope Out: Findings:                 One non-bleeding cratered gastric ulcer with a                            clean ulcer base (Forrest Class III) was found on                            the anterior wall of the gastric antrum. The lesion                            was 10 mm in largest dimension. Biopsies were taken                            with a cold forceps for histology. Verification of                            patient identification for the specimen was done.                            Estimated blood loss was minimal.                           Multiple diminutive sessile polyps were found in                            the gastric fundus and in the gastric body.                            Biopsies were taken with a cold forceps for  histology. Verification of patient identification                            for the specimen was done. Estimated blood loss was                            minimal.                           A non-obstructing Schatzki ring was found at the                            gastroesophageal junction. Biopsies were taken with                            a cold forceps for histology. Verification of                            patient identification for the specimen was done.                            Estimated blood loss was minimal.                           A 2 cm hiatal hernia was present.                           The gastroesophageal flap valve was visualized                            endoscopically and classified as Hill Grade IV (no                            fold, wide open lumen, hiatal hernia present).                           The exam was otherwise without abnormality.                           The cardia and gastric fundus were otherwise normal                             on retroflexion. Complications:            No immediate complications. Estimated Blood Loss:     Estimated blood loss was minimal. Impression:               - Non-bleeding gastric ulcer with a clean ulcer                            base (Forrest Class III). Biopsied.                           - Multiple gastric polyps. Biopsied.                           -  Non-obstructing Schatzki ring. Biopsied.                           - 2 cm hiatal hernia.                           - Gastroesophageal flap valve classified as Hill                            Grade IV (no fold, wide open lumen, hiatal hernia                            present).                           - The examination was otherwise normal. Recommendation:           - Patient has a contact number available for                            emergencies. The signs and symptoms of potential                            delayed complications were discussed with the                            patient. Return to normal activities tomorrow.                            Written discharge instructions were provided to the                            patient.                           - Resume previous diet.                           - Continue present medications.                           - Await pathology results.                           - Avoid aspirin  and NSAIDs - has used some off and                            on so may be cause of ulcert. ? H pylori - await                            pathology.                           Stay on bid PPI Lupita FORBES Commander, MD 04/01/2024 2:04:00 PM This report has been signed electronically.

## 2024-04-02 ENCOUNTER — Telehealth: Payer: Self-pay

## 2024-04-02 NOTE — Telephone Encounter (Signed)
  Follow up Call-     04/01/2024   12:35 PM 02/17/2022   10:09 AM 02/17/2022   10:05 AM  Call back number  Post procedure Call Back phone  # [phone]   Permission to leave phone message Yes  Yes     Patient questions:  Do you have a fever, pain , or abdominal swelling? No. Pain Score  0 *  Have you tolerated food without any problems? Yes.    Have you been able to return to your normal activities? Yes.    Do you have any questions about your discharge instructions: Diet   No. Medications  No. Follow up visit  No.  Do you have questions or concerns about your Care? No.  Actions: * If pain score is 4 or above: No action needed, pain <4.

## 2024-04-04 LAB — SURGICAL PATHOLOGY

## 2024-04-11 ENCOUNTER — Ambulatory Visit: Payer: Self-pay | Admitting: Internal Medicine

## 2024-04-14 ENCOUNTER — Ambulatory Visit

## 2024-04-14 VITALS — BP 145/77 | HR 76 | Temp 98.1°F | Resp 16 | Ht 60.5 in | Wt 140.0 lb

## 2024-04-14 DIAGNOSIS — M810 Age-related osteoporosis without current pathological fracture: Secondary | ICD-10-CM

## 2024-04-14 MED ORDER — ZOLEDRONIC ACID 5 MG/100ML IV SOLN
5.0000 mg | Freq: Once | INTRAVENOUS | Status: AC
  Administered 2024-04-14: 5 mg via INTRAVENOUS
  Filled 2024-04-14: qty 100

## 2024-04-14 MED ORDER — SODIUM CHLORIDE 0.9 % IV SOLN: INTRAVENOUS | Status: DC

## 2024-04-14 MED ORDER — DIPHENHYDRAMINE HCL 25 MG PO CAPS
25.0000 mg | ORAL_CAPSULE | Freq: Once | ORAL | Status: AC
  Administered 2024-04-14: 25 mg via ORAL
  Filled 2024-04-14: qty 1

## 2024-04-14 MED ORDER — ACETAMINOPHEN 325 MG PO TABS
650.0000 mg | ORAL_TABLET | Freq: Once | ORAL | Status: AC
  Administered 2024-04-14: 650 mg via ORAL
  Filled 2024-04-14: qty 2

## 2024-04-14 NOTE — Progress Notes (Signed)
 Diagnosis: Osteoporosis  Provider:  Lonna Coder MD  Procedure: IV Infusion  IV Type: Peripheral, IV Location: L Hand  Reclast  (Zolendronic Acid), Dose: 5 mg  Infusion Start Time: 1548  Infusion Stop Time: 1617  Post Infusion IV Care: Observation period completed, Peripheral IV Discontinued, and stayed for 15 minutes of observation.  Discharge: Condition: Good, Destination: Home . AVS Provided  Performed by:  Heavin Sebree, RN

## 2024-05-07 NOTE — Progress Notes (Signed)
 Chief Complaint: Follow up GERD and melena  HPI:    Natalie Romero is a 73 year old female with a past medical history as listed below including GERD, heart palpitations, colon polyps, hemorrhoids status post banding, hypertension, hyperlipidemia, osteopenia and multiple others, known to Dr. Avram, who returns to clinic today for follow-up after GERD and melena.    02/17/2022 colonoscopy normal with a 5-year recall recommended due to history of colon polyps on a colonoscopy in 2006 in 2013.    11/06/2023 CMP and CBC normal, hemoglobin 14.6.  High cholesterol, A1c 5.8.    03/27/2024 patient called in and discussed black tarry stools.  Advised to go to the ER.    03/28/2024 office visit with Lauraine Pa to discuss melena.  At that time patient had a CBC and CMP, started on Protonix 40 mg twice daily, told to stop NSAIDs and plan for EGD 04/01/2024.    03/28/24 CMP with a glucose of 119 and normal CBC.    03/31/2024 CBC normal.    04/01/2024 EGD with nonbleeding gastric ulcer with a clean ulcer base, multiple gastric polyps, nonobstructing Schatzki's ring biopsied and 2 cm hiatal hernia.  Biopsies with reactive gastropathy and foveolar hyperplasia.  No H. pylori.  Polyps were benign hyperplastic and fundic gland polyps.  Esophageal stricture which showed inflammation.  At that time Dr. Avram discussed no need for repeat EGD as it was not a large ulcer.  Biopsies benign.  Recommended daily PPI.  Drop down to once a day November 7.    Today, the patient presents to clinic and tells me that she feels completely better.  She has seen no further melena.  Explains that she probably was having reflux symptoms for years and just did not realize it.  Occasionally will get a horrible taste in her mouth which she now realizes could have been acid.  Experience hoarseness and occasional cough.    Denies fever, chills or weight loss.  Past Medical History:  Diagnosis Date   Dysrhythmia    Palpations   Flu 07/27/2013    GERD (gastroesophageal reflux disease)    Heart murmur    Hematuria    cleared by urologist   Hemorrhoid    Hx of colonic polyps 02/17/2022   Hypertension    Osteopenia     Past Surgical History:  Procedure Laterality Date   BREAST BIOPSY Right    BREAST EXCISIONAL BIOPSY Left 1985   BREAST EXCISIONAL BIOPSY Right 1990   BREAST SURGERY Bilateral    COLONOSCOPY W/ BIOPSIES AND POLYPECTOMY     DENTAL SURGERY     HARDWARE REMOVAL Right 09/04/2014   Procedure: HARDWARE REMOVAL RIGHT ANKLE MEDIAL MALLEOLUS SCREWS;  Surgeon: Kay CHRISTELLA Cummins, MD;  Location: [facility] OR;  Service: Orthopedics;  Laterality: Right;   HEMORRHOID BANDING     ORIF ANKLE FRACTURE Right 08/20/2013   Procedure: OPEN REDUCTION INTERNAL FIXATION (ORIF) RIGHT  ANKLE FRACTURE;  Surgeon: Kay Ozell Cummins, MD;  Location: [facility] OR;  Service: Orthopedics;  Laterality: Right;   removal of right ankle screws Right 09/04/2014   medial malleolus-2 screws   TONSILLECTOMY      Current Outpatient Medications  Medication Sig Dispense Refill   acetaminophen  (TYLENOL ) 500 MG tablet Take 500 mg by mouth every 6 (six) hours as needed.     aspirin  EC 325 MG tablet Take 325 mg by mouth daily.     hydrochlorothiazide (HYDRODIURIL) 12.5 MG tablet Take 12.5 mg by mouth every morning.  ibuprofen (ADVIL,MOTRIN) 200 MG tablet Take 600 mg by mouth every 8 (eight) hours as needed for moderate pain.     pantoprazole (PROTONIX) 40 MG tablet Take 1 tablet (40 mg total) by mouth 2 (two) times daily. 180 tablet 3   polyethylene glycol powder (GLYCOLAX /MIRALAX ) powder Take 1 Container by mouth 2 (two) times daily as needed (for constipation due to pain medication).     triamcinolone cream (KENALOG) 0.1 % Apply 1 Application topically as needed.     No current facility-administered medications for this visit.    Allergies as of 05/08/2024 - Review Complete 04/01/2024  Allergen Reaction Noted   Oxycodone  Nausea Only 09/04/2014    Family History   Problem Relation Age of Onset   Cancer Mother    Pulmonary embolism Sister    Colon cancer Neg Hx    Colon polyps Neg Hx    Rectal cancer Neg Hx    Stomach cancer Neg Hx    Esophageal cancer Neg Hx     Social History   Socioeconomic History   Marital status: Married    Spouse name: Not on file   Number of children: Not on file   Years of education: Not on file   Highest education level: Not on file  Occupational History   Not on file  Tobacco Use   Smoking status: Never   Smokeless tobacco: Never  Vaping Use   Vaping status: Never Used  Substance and Sexual Activity   Alcohol  use: Yes    Comment: a glass of wine once or twice a month   Drug use: No   Sexual activity: Not on file  Other Topics Concern   Not on file  Social History Narrative   Not on file   Social Drivers of Health   Financial Resource Strain: Not on file  Food Insecurity: Not on file  Transportation Needs: Not on file  Physical Activity: Not on file  Stress: Not on file  Social Connections: Not on file  Intimate Partner Violence: Not on file    Review of Systems:    Constitutional: No weight loss, fever or chills Cardiovascular: No chest pain, chest pressure or palpitations   Respiratory: No SOB  Gastrointestinal: See HPI and otherwise negative   Physical Exam:  Vital signs: BP 110/64   Pulse 89   Ht 5' 0.5 (1.537 m)   Wt 138 lb 6 oz (62.8 kg)   SpO2 99%   BMI 26.58 kg/m    Constitutional:   Pleasant Caucasian female appears to be in NAD, Well developed, Well nourished, alert and cooperative Respiratory: Respirations even and unlabored. Lungs clear to auscultation bilaterally.   No wheezes, crackles, or rhonchi.  Cardiovascular: Normal S1, S2. No MRG. Regular rate and rhythm. No peripheral edema, cyanosis or pallor.  Gastrointestinal:  Soft, nondistended, nontender. No rebound or guarding. Normal bowel sounds. No appreciable masses or hepatomegaly. Rectal:  Not performed.   Psychiatric: Oriented to person, place and time. Demonstrates good judgement and reason without abnormal affect or behaviors.  RELEVANT LABS AND IMAGING: CBC    Component Value Date/Time   WBC 7.0 03/31/2024 1329   RBC 4.46 03/31/2024 1329   HGB 13.4 03/31/2024 1329   HCT 39.5 03/31/2024 1329   PLT 387.0 03/31/2024 1329   MCV 88.5 03/31/2024 1329   MCH 30.0 08/19/2013 1157   MCHC 33.9 03/31/2024 1329   RDW 12.4 03/31/2024 1329   LYMPHSABS 1.9 03/31/2024 1329   MONOABS 0.6 03/31/2024 1329  EOSABS 0.1 03/31/2024 1329   BASOSABS 0.0 03/31/2024 1329    CMP     Component Value Date/Time   NA 141 03/31/2024 1329   K 3.3 (L) 03/31/2024 1329   CL 99 03/31/2024 1329   CO2 29 03/31/2024 1329   GLUCOSE 117 (H) 03/31/2024 1329   BUN 16 03/31/2024 1329   CREATININE 0.97 03/31/2024 1329   CALCIUM  9.8 03/31/2024 1329   PROT 7.4 03/28/2024 1425   ALBUMIN 4.6 03/28/2024 1425   AST 22 03/28/2024 1425   ALT 18 03/28/2024 1425   ALKPHOS 80 03/28/2024 1425   BILITOT 0.6 03/28/2024 1425    Assessment: 1.  GERD: Describes occasional acid in her mouth, hoarseness and cough, better now on Pantoprazole 40 mg daily 2.  Melena: With recent EGD 04/01/2024 with a small gastric ulcer with clean ulcer base, negative for H. pylori, no repeat EGD recommended per Dr. Avram, told to stay on a daily PPI   Plan: 1.  Continue Pantoprazole 40 mg daily.  Patient tells me she has enough of this medication at home. 2.  Reviewed antireflux diet and lifestyle modifications. 3.  Patient to follow in clinic with us  as needed.  Natalie Failing, PA-C [facility] 05/07/2024, 8:19 AM  Cc: Natalie Elsie JONETTA Mickey., MD

## 2024-05-08 ENCOUNTER — Ambulatory Visit: Admitting: Physician Assistant

## 2024-05-08 ENCOUNTER — Encounter: Payer: Self-pay | Admitting: Physician Assistant

## 2024-05-08 VITALS — BP 110/64 | HR 89 | Ht 60.5 in | Wt 138.4 lb

## 2024-05-08 DIAGNOSIS — K259 Gastric ulcer, unspecified as acute or chronic, without hemorrhage or perforation: Secondary | ICD-10-CM | POA: Diagnosis not present

## 2024-05-08 DIAGNOSIS — K219 Gastro-esophageal reflux disease without esophagitis: Secondary | ICD-10-CM | POA: Diagnosis not present

## 2024-05-08 NOTE — Patient Instructions (Signed)
 Continue Pantoprazole 40 mg daily

## 2024-06-26 ENCOUNTER — Telehealth: Admitting: Physician Assistant

## 2024-06-26 DIAGNOSIS — J101 Influenza due to other identified influenza virus with other respiratory manifestations: Secondary | ICD-10-CM | POA: Diagnosis not present

## 2024-06-26 MED ORDER — OSELTAMIVIR PHOSPHATE 75 MG PO CAPS: 75.0000 mg | ORAL_CAPSULE | Freq: Two times a day (BID) | ORAL | 0 refills | Status: AC

## 2024-06-26 NOTE — Progress Notes (Signed)
 " Virtual Visit Consent   Natalie Romero, you are scheduled for a virtual visit with a [facility] provider today. Just as with appointments in the office, your consent must be obtained to participate. Your consent will be active for this visit and any virtual visit you may have with one of our providers in the next 365 days. If you have a MyChart account, a copy of this consent can be sent to you electronically.  As this is a virtual visit, video technology does not allow for your provider to perform a traditional examination. This may limit your provider's ability to fully assess your condition. If your provider identifies any concerns that need to be evaluated in person or the need to arrange testing (such as labs, EKG, etc.), we will make arrangements to do so. Although advances in technology are sophisticated, we cannot ensure that it will always work on either your end or our end. If the connection with a video visit is poor, the visit may have to be switched to a telephone visit. With either a video or telephone visit, we are not always able to ensure that we have a secure connection.  By engaging in this virtual visit, you consent to the provision of healthcare and authorize for your insurance to be billed (if applicable) for the services provided during this visit. Depending on your insurance coverage, you may receive a charge related to this service.  I need to obtain your verbal consent now. Are you willing to proceed with your visit today? Natalie Romero has provided verbal consent on 06/26/2024 for a virtual visit (video or telephone). Delon CHRISTELLA Dickinson, PA-C  Date: 06/26/2024 11:26 AM   Virtual Visit via Video Note   I, Delon CHRISTELLA Dickinson, connected with  Natalie Romero  ([id number], [birth year]) on 06/26/2024 at 11:15 AM EST by a video-enabled telemedicine application and verified that I am speaking with the correct person using two identifiers.  Location: Patient: Virtual Visit Location  Patient: Home Provider: Virtual Visit Location Provider: Home Office   I discussed the limitations of evaluation and management by telemedicine and the availability of in person appointments. The patient expressed understanding and agreed to proceed.    History of Present Illness: Natalie Romero is a 74 y.o. who identifies as a female who was assigned female at birth, and is being seen today for flu.  HPI: URI  This is a new problem. The current episode started in the past 7 days (Symptoms started 06/24/24; tested positve on at home flu test for Influenza A on 06/25/24). The problem has been gradually worsening. Maximum temperature: low grade yesterday. The fever has been present for Less than 1 day. Associated symptoms include congestion, coughing, headaches and nausea. Pertinent negatives include no diarrhea, ear pain, plugged ear sensation, rhinorrhea, sinus pain, sore throat, vomiting or wheezing. Associated symptoms comments: Body aches. Treatments tried: tylenol . The treatment provided no relief.     Problems:  Patient Active Problem List   Diagnosis Date Noted   Chondromalacia patellae, left knee 04/24/2023   Knee contusion 04/03/2023   Finger fracture, right 04/03/2023   Senile osteoporosis 02/20/2023   Hx of colonic polyps 02/17/2022   Trimalleolar fracture of ankle, closed 08/20/2013    Allergies: Allergies[1] Medications: Current Medications[2]  Observations/Objective: Patient is well-developed, well-nourished in no acute distress.  Resting comfortably at home.  Head is normocephalic, atraumatic.  No labored breathing.  Speech is clear and coherent with logical content.  Patient is  alert and oriented at baseline.    Assessment and Plan: 1. Influenza A (Primary) - oseltamivir (TAMIFLU) 75 MG capsule; Take 1 capsule (75 mg total) by mouth 2 (two) times daily.  Dispense: 10 capsule; Refill: 0  - Tested positive for Influenza A - Tamiflu prescribed - Continue OTC  medication of choice for symptomatic management - Push fluids - Rest - Seek in person evaluation if symptoms worsen or fail to improve    Follow Up Instructions: I discussed the assessment and treatment plan with the patient. The patient was provided an opportunity to ask questions and all were answered. The patient agreed with the plan and demonstrated an understanding of the instructions.  A copy of instructions were sent to the patient via MyChart unless otherwise noted below.    The patient was advised to call back or seek an in-person evaluation if the symptoms worsen or if the condition fails to improve as anticipated.    Delon CHRISTELLA Dickinson, PA-C     [1]  Allergies Allergen Reactions   Oxycodone  Nausea Only  [2]  Current Outpatient Medications:    oseltamivir (TAMIFLU) 75 MG capsule, Take 1 capsule (75 mg total) by mouth 2 (two) times daily., Disp: 10 capsule, Rfl: 0   hydrochlorothiazide (HYDRODIURIL) 12.5 MG tablet, Take 12.5 mg by mouth every morning., Disp: , Rfl:    pantoprazole  (PROTONIX ) 40 MG tablet, Take 40 mg by mouth daily., Disp: , Rfl:    polyethylene glycol powder (GLYCOLAX /MIRALAX ) powder, Take 1 Container by mouth 2 (two) times daily as needed (for constipation due to pain medication)., Disp: , Rfl:   "

## 2024-06-26 NOTE — Patient Instructions (Signed)
 " Natalie Romero, thank you for joining Delon CHRISTELLA Dickinson, PA-C for today's virtual visit.  While this provider is not your primary care provider (PCP), if your PCP is located in our provider database this encounter information will be shared with them immediately following your visit.   A [facility] MyChart account gives you access to today's visit and all your visits, tests, and labs performed at [facility]  click here if you don't have a [facility] MyChart account or go to [email or web address]  Consent: (Patient) Natalie Romero provided verbal consent for this virtual visit at the beginning of the encounter.  Current Medications:  Current Outpatient Medications:    oseltamivir (TAMIFLU) 75 MG capsule, Take 1 capsule (75 mg total) by mouth 2 (two) times daily., Disp: 10 capsule, Rfl: 0   hydrochlorothiazide (HYDRODIURIL) 12.5 MG tablet, Take 12.5 mg by mouth every morning., Disp: , Rfl:    pantoprazole  (PROTONIX ) 40 MG tablet, Take 40 mg by mouth daily., Disp: , Rfl:    polyethylene glycol powder (GLYCOLAX /MIRALAX ) powder, Take 1 Container by mouth 2 (two) times daily as needed (for constipation due to pain medication)., Disp: , Rfl:    Medications ordered in this encounter:  Meds ordered this encounter  Medications   oseltamivir (TAMIFLU) 75 MG capsule    Sig: Take 1 capsule (75 mg total) by mouth 2 (two) times daily.    Dispense:  10 capsule    Refill:  0    Supervising Provider:   BLAISE ALEENE KIDD [[id number]]     *If you need refills on other medications prior to your next appointment, please contact your pharmacy*  Follow-Up: Call back or seek an in-person evaluation if the symptoms worsen or if the condition fails to improve as anticipated.  [facility] [phone]  Other Instructions Influenza, Adult Influenza is also called the flu. It's an infection that affects your respiratory tract. This includes your nose, throat, windpipe, and  lungs. The flu is contagious. This means it spreads easily from person to person. It causes symptoms that are like a cold. It can also cause a high fever and body aches. What are the causes? The flu is caused by the influenza virus. You can get it by: Breathing in droplets that are in the air after an infected person coughs or sneezes. Touching something that has the virus on it and then touching your mouth, nose, or eyes. What increases the risk? You may be more likely to get the flu if: You don't wash your hands often. You're near a lot of people during cold and flu season. You touch your mouth, eyes, or nose without washing your hands first. You don't get a flu shot each year. You may also be more at risk for the flu and serious problems, such as a lung infection called pneumonia, if: You're older than 65. You're pregnant. Your immune system is weak. Your immune system is your body's defense system. You have a long-term, or chronic, condition, such as: Heart, kidney, or lung disease. Diabetes. A liver disorder. Asthma. You're very overweight. You have anemia. This is when you don't have enough red blood cells in your body. What are the signs or symptoms? Flu symptoms often start all of a sudden. They may last 4-14 days and include: Fever and chills. Headaches, body aches, or muscle aches. Sore throat. Cough. Runny or stuffy nose. Discomfort in your chest. Not wanting to eat as much as normal. Feeling weak or  tired. Feeling dizzy. Nausea or vomiting. How is this diagnosed? The flu may be diagnosed based on your symptoms and medical history. You may also have a physical exam. A swab may be taken from your nose or throat and tested for the virus. How is this treated? If the flu is found early, you can be treated with antiviral medicine. This may be given to you by mouth or through an IV. It can help you feel less sick and get better faster. Taking care of yourself at home can  also help your symptoms get better. Your health care provider may tell you to: Take over-the-counter medicines. Drink lots of fluids. The flu often goes away on its own. If you have very bad symptoms or problems caused by the flu, you may need to be treated in a hospital. Follow these instructions at home: Activity Rest as needed. Get lots of sleep. Stay home from work or school as told by your provider. Leave home only to go see your provider. Do not leave home for other reasons until you don't have a fever for 24 hours without taking medicine. Eating and drinking Take an oral rehydration solution (ORS). This is a drink that is sold at pharmacies and stores. Drink enough fluid to keep your pee pale yellow. Try to drink small amounts of clear fluids. These include water , ice chips, fruit juice mixed with water , and low-calorie sports drinks. Try to eat bland foods that are easy to digest. These include bananas, applesauce, rice, lean meats, toast, and crackers. Avoid drinks that have a lot of sugar or caffeine in them. These include energy drinks, regular sports drinks, and soda. Do not drink alcohol . Do not eat spicy or fatty foods. General instructions     Take your medicines only as told by your provider. Use a cool mist humidifier to add moisture to the air in your home. This can make it easier for you to breathe. You should also clean the humidifier every day. To do so: Empty the water . Pour clean water  in. Cover your mouth and nose when you cough or sneeze. Wash your hands with soap and water  often and for at least 20 seconds. It's extra important to do so after you cough or sneeze. If you can't use soap and water , use hand sanitizer. How is this prevented?  Get a flu shot every year. Ask your provider when you should get your flu shot. Stay away from people who are sick during fall and winter. Fall and winter are cold and flu season. Contact a health care provider if: You get  new symptoms. You have chest pain. You have watery poop, also called diarrhea. You have a fever. Your cough gets worse. You start to have more mucus. You feel like you may vomit, or you vomit. Get help right away if: You become short of breath or have trouble breathing. Your skin or nails turn blue. You have very bad pain or stiffness in your neck. You get a sudden headache or pain in your face or ear. You vomit each time you eat or drink. These symptoms may be an emergency. Call 911 right away. Do not wait to see if the symptoms will go away. Do not drive yourself to the hospital. This information is not intended to replace advice given to you by your health care provider. Make sure you discuss any questions you have with your health care provider. Document Revised: 03/15/2023 Document Reviewed: 07/20/2022 Elsevier Patient Education  2024  [employer]   If you have been instructed to have an in-person evaluation today at a local Urgent Care facility, please use the link below. It will take you to a list of all of our available [facility], including address, phone number and hours of operation. Please do not delay care.  [facility]  If you or a family member do not have a primary care provider, use the link below to schedule a visit and establish care. When you choose a [facility] primary care physician or advanced practice provider, you gain a long-term partner in health. Find a Primary Care Provider  Learn more about [facility] in-office and virtual care options: [facility] - Get Care Now "
